# Patient Record
Sex: Male | Born: 1960 | Race: White | Hispanic: No | Marital: Married | Smoking: Former smoker
Health system: Southern US, Community
[De-identification: ages and names within clinical notes are randomized; demographics above are authoritative.]

## PROBLEM LIST (undated history)

## (undated) DIAGNOSIS — I639 Cerebral infarction, unspecified: Secondary | ICD-10-CM

## (undated) DIAGNOSIS — M1990 Unspecified osteoarthritis, unspecified site: Secondary | ICD-10-CM

## (undated) DIAGNOSIS — I10 Essential (primary) hypertension: Secondary | ICD-10-CM

## (undated) DIAGNOSIS — E079 Disorder of thyroid, unspecified: Secondary | ICD-10-CM

## (undated) DIAGNOSIS — I4891 Unspecified atrial fibrillation: Secondary | ICD-10-CM

## (undated) DIAGNOSIS — D497 Neoplasm of unspecified behavior of endocrine glands and other parts of nervous system: Secondary | ICD-10-CM

## (undated) DIAGNOSIS — E119 Type 2 diabetes mellitus without complications: Secondary | ICD-10-CM

## (undated) HISTORY — PX: OTHER SURGICAL HISTORY: SHX169

## (undated) HISTORY — PX: REPLACEMENT TOTAL KNEE BILATERAL: SUR1225

## (undated) HISTORY — PX: PITUITARY SURGERY: SHX203

## (undated) HISTORY — PX: EYE SURGERY: SHX253

## (undated) HISTORY — PX: TONSILLECTOMY: SUR1361

---

## 2019-07-04 ENCOUNTER — Other Ambulatory Visit: Payer: Self-pay

## 2019-07-04 ENCOUNTER — Ambulatory Visit (INDEPENDENT_AMBULATORY_CARE_PROVIDER_SITE_OTHER): Payer: 59

## 2019-07-04 ENCOUNTER — Ambulatory Visit
Admission: EM | Admit: 2019-07-04 | Discharge: 2019-07-04 | Disposition: A | Discharge status: Home / Self Care | Payer: 59 | Attending: Family Medicine | Admitting: Family Medicine

## 2019-07-04 DIAGNOSIS — M779 Enthesopathy, unspecified: Secondary | ICD-10-CM

## 2019-07-04 DIAGNOSIS — M19021 Primary osteoarthritis, right elbow: Secondary | ICD-10-CM

## 2019-07-04 HISTORY — DX: Type 2 diabetes mellitus without complications: E11.9

## 2019-07-04 HISTORY — DX: Unspecified atrial fibrillation: I48.91

## 2019-07-04 HISTORY — DX: Essential (primary) hypertension: I10

## 2019-07-04 MED ORDER — PREDNISONE 10 MG PO TABS
ORAL_TABLET | ORAL | 0 refills | Status: DC
Start: 2019-07-04 — End: 2020-01-05

## 2019-07-04 NOTE — ED Provider Notes (Signed)
[facility]    CSN: [id number] Arrival date & time: 07/04/19  1336      History   Chief Complaint Chief Complaint  Patient presents with  . Elbow Pain    HPI Patrick Sandoval is a 59 y.o. male.   59 yo male with a c/o right elbow pain and swelling for the past 2 weeks. Denies any traumatic injury, fevers, chills, redness, rash, numbness/tingling. States has been applying ice which helps.      Past Medical History:  Diagnosis Date  . Atrial fibrillation ([place])   . Diabetes mellitus without complication ([place])   . Hypertension     There are no problems to display for this patient.   Past Surgical History:  Procedure Laterality Date  . REPLACEMENT TOTAL KNEE BILATERAL         Home Medications    Prior to Admission medications   Medication Sig Start Date End Date Taking? Authorizing Provider  anagrelide (AGRYLIN) 0.5 MG capsule Take 0.5 mg by mouth 4 (four) times daily.   Yes [provider]  atorvastatin (LIPITOR) 10 MG tablet Take 10 mg by mouth daily.   Yes [provider]  furosemide (LASIX) 20 MG tablet Take 20 mg by mouth.   Yes [provider]  gabapentin (NEURONTIN) 300 MG capsule Take 300 mg by mouth 3 (three) times daily.   Yes [provider]  Insulin Glargine (LANTUS [place]) Inject into the skin.   Yes [provider]  INSULIN LISPRO [place] Inject into the skin.   Yes [provider]  levothyroxine (SYNTHROID) 125 MCG tablet Take 125 mcg by mouth daily before breakfast.   Yes [provider]  liothyronine (CYTOMEL) 5 MCG tablet Take 5 mcg by mouth daily.   Yes [provider]  metFORMIN (GLUCOPHAGE) 1000 MG tablet Take 1,000 mg by mouth 2 (two) times daily with a meal.   Yes [provider]  nebivolol (BYSTOLIC) 10 MG tablet Take 20 mg by mouth daily.   Yes [provider]  olmesartan-hydrochlorothiazide (BENICAR HCT) 40-25 MG tablet Take 1 tablet by mouth daily.    Yes [provider]  omeprazole (PRILOSEC) 20 MG capsule Take 20 mg by mouth daily.   Yes [provider]  rivaroxaban (XARELTO) 20 MG TABS tablet Take 20 mg by mouth daily with supper.   Yes [provider]  zolpidem (AMBIEN) 10 MG tablet Take 10 mg by mouth at bedtime as needed for sleep.   Yes [provider]  predniSONE (DELTASONE) 10 MG tablet Start 60 mg po day one, then 50 mg po day two, taper by 10 mg daily until complete. 07/04/19   Norval Gable, MD    Family History History reviewed. No pertinent family history.  Social History Social History   Tobacco Use  . Smoking status: Former Smoker    Quit date: 2010    Years since quitting: 11.4  . Smokeless tobacco: Never Used  Substance Use Topics  . Alcohol use: Not on file  . Drug use: Not on file     Allergies   Codeine, Morphine and related, and Vicodin [hydrocodone-acetaminophen]   Review of Systems Review of Systems   Physical Exam Triage Vital Signs ED Triage Vitals  Enc Vitals Group     BP 07/04/19 1353 (!) 155/89     Pulse Rate 07/04/19 1353 70     Resp 07/04/19 1353 17     Temp 07/04/19 1353 98 F (36.7 C)  Temp Source 07/04/19 1353 Oral     SpO2 07/04/19 1353 100 %     Weight --      Height --      Head Circumference --      Peak Flow --      Pain Score 07/04/19 1348 8     Pain Loc --      Pain Edu? --      Excl. in [place]? --    No data found.  Updated Vital Signs BP (!) 155/89 (BP Location: Left Arm)   Pulse 70 Comment: afib  Temp 98 F (36.7 C) (Oral)   Resp 17   SpO2 100%   Visual Acuity Right Eye Distance:   Left Eye Distance:   Bilateral Distance:    Right Eye Near:   Left Eye Near:    Bilateral Near:     Physical Exam Vitals and nursing note reviewed.  Constitutional:      General: He is not in acute distress.    Appearance: He is not toxic-appearing or diaphoretic.  Musculoskeletal:     Right elbow: Swelling present. No deformity,  effusion or lacerations. Decreased range of motion. Tenderness (diffuse) present.     Comments: Right upper extremity neurovascularly intact  Neurological:     Mental Status: He is alert.      UC Treatments / Results  Labs (all labs ordered are listed, but only abnormal results are displayed) Labs Reviewed - No data to display  EKG   Radiology DG Elbow Complete Right  Result Date: 07/04/2019 CLINICAL DATA:  Atraumatic right elbow pain and swelling. EXAM: RIGHT ELBOW - COMPLETE 3+ VIEW COMPARISON:  None. FINDINGS: There is no evidence of an acute fracture, dislocation, or joint effusion. A large bony spur is seen along the olecranon process. Chronic and degenerative deformities are seen along the medial epicondyle and lateral epicondyle of the distal right humerus. An additional small chronic deformity is seen involving the head of the right radius. Mild dorsal soft tissue swelling is seen. IMPRESSION: 1. No acute osseous abnormality. 2. Chronic and degenerative changes, as described above. Electronically Signed   By: Virgina Norfolk M.D.   On: 07/04/2019 15:14    Procedures Procedures (including critical care time)  Medications Ordered in UC Medications - No data to display  Initial Impression / Assessment and Plan / UC Course  I have reviewed the triage vital signs and the nursing notes.  Pertinent labs & imaging results that were available during my care of the patient were reviewed by me and considered in my medical decision making (see chart for details).     Final Clinical Impressions(s) / UC Diagnoses   Final diagnoses:  Primary osteoarthritis of right elbow  Bony spur     Discharge Instructions     Rest, ice, over the counter tylenol Follow up with orthopedist    ED Prescriptions    Medication Sig Dispense Auth. Provider   predniSONE (DELTASONE) 10 MG tablet Start 60 mg po day one, then 50 mg po day two, taper by 10 mg daily until complete. 21 tablet Norval Gable, MD      1. x-ray results and diagnosis reviewed with patient 2. rx as per orders above; reviewed possible side effects, interactions, risks and benefits  3. Recommend supportive treatment as above 4. Follow up with orthopedist 5. Follow-up prn if symptoms worsen or don't improve   I have reviewed the PDMP during this encounter.   Norval Gable, MD  07/04/19 1810  

## 2019-07-04 NOTE — ED Triage Notes (Signed)
Patient reports 2 week history of right elbow pain, no injury. Denies any increase in numbness or tingling distal to pain. Patient reports ice helps mildy, heat does not help. [place] is swollen. Limited ROM.

## 2019-07-04 NOTE — Discharge Instructions (Signed)
Rest, ice, over the counter tylenol Follow up with orthopedist

## 2020-01-05 ENCOUNTER — Other Ambulatory Visit: Payer: Self-pay

## 2020-01-05 ENCOUNTER — Ambulatory Visit
Admission: EM | Admit: 2020-01-05 | Discharge: 2020-01-05 | Disposition: A | Discharge status: Home / Self Care | Payer: 59

## 2020-01-05 ENCOUNTER — Encounter: Payer: Self-pay | Admitting: Emergency Medicine

## 2020-01-05 DIAGNOSIS — M7022 Olecranon bursitis, left elbow: Secondary | ICD-10-CM

## 2020-01-05 MED ORDER — PREDNISONE 10 MG (21) PO TBPK: ORAL_TABLET | Freq: Every day | ORAL | 0 refills | Status: DC

## 2020-01-05 NOTE — Discharge Instructions (Addendum)
Take the prednisone according to the package instructions.  Wear compression sleeve to help your body reabsorb the fluid in your elbow.  If the swelling increases, you develop redness, increased heat, or increased pain return for reevaluation and needle aspiration.

## 2020-01-05 NOTE — ED Triage Notes (Signed)
Pt c/o left elbow pain. Started about 3 weeks ago. He states he has had this similar problem in the past and was tendonitis. No known injury to elbow recently.

## 2020-01-05 NOTE — ED Provider Notes (Signed)
[facility]    CSN: [id number] Arrival date & time: 01/05/20  1441      History   Chief Complaint Chief Complaint  Patient presents with  . Elbow Pain    left    HPI Patrick Sandoval is a 59 y.o. male.   HPI   59 year old male here for evaluation of pain in his left elbow.  Patient reports that he has had pain for last 3 weeks.  Patient more said he had something like this in the past and he was diagnosed with tendinitis.  Patient has swelling to his elbow at the tip, it is warm, and mildly red.  The area is tender to touch.  Patient denies fever.  Patient does have numbness and tingling in his left ring and little finger which predates the symptoms.  Past Medical History:  Diagnosis Date  . Atrial fibrillation ([place])   . Diabetes mellitus without complication ([place])   . Hypertension     There are no problems to display for this patient.   Past Surgical History:  Procedure Laterality Date  . REPLACEMENT TOTAL KNEE BILATERAL         Home Medications    Prior to Admission medications   Medication Sig Start Date End Date Taking? Authorizing Provider  anagrelide (AGRYLIN) 0.5 MG capsule Take 0.5 mg by mouth 4 (four) times daily.   Yes [provider]  atorvastatin (LIPITOR) 10 MG tablet Take 10 mg by mouth daily.   Yes [provider]  furosemide (LASIX) 20 MG tablet Take 20 mg by mouth.   Yes [provider]  gabapentin (NEURONTIN) 300 MG capsule Take 300 mg by mouth 3 (three) times daily.   Yes [provider]  insulin lispro (HUMALOG KWIKPEN) 200 UNIT/ML KwikPen Inject into the skin. 12/12/19  Yes [provider]  INSULIN LISPRO [place] Inject into the skin.   Yes [provider]  levothyroxine (SYNTHROID) 125 MCG tablet Take by mouth. 03/19/19  Yes [provider]  liothyronine (CYTOMEL) 5 MCG tablet Take 1 tablet by mouth daily. 08/12/19  Yes [provider]  metFORMIN (GLUCOPHAGE) 1000  MG tablet Take 1,000 mg by mouth 2 (two) times daily with a meal.   Yes [provider]  nebivolol (BYSTOLIC) 10 MG tablet Take 20 mg by mouth daily.   Yes [provider]  olmesartan-hydrochlorothiazide (BENICAR HCT) 40-25 MG tablet Take 1 tablet by mouth daily.   Yes [provider]  omeprazole (PRILOSEC) 20 MG capsule Take 20 mg by mouth daily.   Yes [provider]  rivaroxaban (XARELTO) 20 MG TABS tablet Take 20 mg by mouth daily with supper.   Yes [provider]  TRULICITY 1.5 TU/8.8KC SOPN Inject into the skin. 11/20/19  Yes [provider]  zolpidem (AMBIEN) 10 MG tablet Take 10 mg by mouth at bedtime as needed for sleep.   Yes [provider]  TOUJEO MAX SOLOSTAR 300 UNIT/ML Solostar Pen Inject into the skin. 12/12/19 01/05/20 Yes [provider]  levothyroxine (SYNTHROID) 125 MCG tablet Take 125 mcg by mouth daily before breakfast.    [provider]  liothyronine (CYTOMEL) 5 MCG tablet Take 5 mcg by mouth daily.    [provider]  predniSONE (STERAPRED UNI-PAK 21 TAB) 10 MG (21) TBPK tablet Take by mouth daily. Take 6 tabs by mouth daily  for 2 days, then 5 tabs for 2 days, then 4 tabs for 2 days, then 3 tabs for 2  days, 2 tabs for 2 days, then 1 tab by mouth daily for 2 days 01/05/20   Margarette Canada, NP    Family History History reviewed. No pertinent family history.  Social History Social History   Tobacco Use  . Smoking status: Former Smoker    Quit date: 2010    Years since quitting: 11.9  . Smokeless tobacco: Never Used  Vaping Use  . Vaping Use: Never used  Substance Use Topics  . Alcohol use: Not Currently  . Drug use: Not Currently     Allergies   Codeine, Morphine and related, and Vicodin [hydrocodone-acetaminophen]   Review of Systems Review of Systems  Constitutional: Negative for activity change, appetite change and fever.  Musculoskeletal: Positive for arthralgias  and joint swelling. Negative for myalgias.  Skin: Positive for color change.  Neurological: Positive for numbness.  Hematological: Negative.   Psychiatric/Behavioral: Negative.      Physical Exam Triage Vital Signs ED Triage Vitals  Enc Vitals Group     BP 01/05/20 1509 119/74     Pulse Rate 01/05/20 1509 85     Resp 01/05/20 1509 18     Temp 01/05/20 1509 98 F (36.7 C)     Temp Source 01/05/20 1509 Oral     SpO2 01/05/20 1509 100 %     Weight 01/05/20 1506 278 lb (126.1 kg)     Height 01/05/20 1506 5\' 11"  (1.803 m)     Head Circumference --      Peak Flow --      Pain Score 01/05/20 1505 8     Pain Loc --      Pain Edu? --      Excl. in [place]? --    No data found.  Updated Vital Signs BP 119/74 (BP Location: Right Arm)   Pulse 85   Temp 98 F (36.7 C) (Oral)   Resp 18   Ht 5\' 11"  (1.803 m)   Wt 278 lb (126.1 kg)   SpO2 100%   BMI 38.77 kg/m   Visual Acuity Right Eye Distance:   Left Eye Distance:   Bilateral Distance:    Right Eye Near:   Left Eye Near:    Bilateral Near:     Physical Exam Vitals and nursing note reviewed.  Constitutional:      General: He is not in acute distress.    Appearance: Normal appearance. He is normal weight. He is not toxic-appearing.  HENT:     Head: Normocephalic and atraumatic.  Eyes:     General: No scleral icterus.    Extraocular Movements: Extraocular movements intact.     Conjunctiva/sclera: Conjunctivae normal.     Pupils: Pupils are equal, round, and reactive to light.  Musculoskeletal:        General: Swelling and tenderness present. No deformity or signs of injury. Normal range of motion.  Skin:    General: Skin is warm and dry.     Capillary Refill: Capillary refill takes less than 2 seconds.     Findings: Erythema present. No rash.  Neurological:     General: No focal deficit present.     Mental Status: He is alert and oriented to person, place, and time.  Psychiatric:        Mood and Affect: Mood normal.         Behavior: Behavior normal.        Thought Content: Thought content normal.        Judgment: Judgment normal.  UC Treatments / Results  Labs (all labs ordered are listed, but only abnormal results are displayed) Labs Reviewed - No data to display  EKG   Radiology No results found.  Procedures Procedures (including critical care time)  Medications Ordered in UC Medications - No data to display  Initial Impression / Assessment and Plan / UC Course  I have reviewed the triage vital signs and the nursing notes.  Pertinent labs & imaging results that were available during my care of the patient were reviewed by me and considered in my medical decision making (see chart for details).   Patient is here for evaluation of pain and swelling to his left elbow x3 weeks.  Physical exam reveals a swollen area over laying the olecranon process of the left elbow.  The area is mildly erythematous, warm, and tender to touch.  The area has mild fluctuance.  Exam is consistent with olecranon bursitis.  Patient offered the option of needle aspiration versus compression and steroid therapy.  Patient wants to try compression and steroid therapy first and if his symptoms get worse return for puncture aspiration.   Final Clinical Impressions(s) / UC Diagnoses   Final diagnoses:  Olecranon bursitis of left elbow     Discharge Instructions     Take the prednisone according to the package instructions.  Wear compression sleeve to help your body reabsorb the fluid in your elbow.  If the swelling increases, you develop redness, increased heat, or increased pain return for reevaluation and needle aspiration.    ED Prescriptions    Medication Sig Dispense Auth. Provider   predniSONE (STERAPRED UNI-PAK 21 TAB) 10 MG (21) TBPK tablet Take by mouth daily. Take 6 tabs by mouth daily  for 2 days, then 5 tabs for 2 days, then 4 tabs for 2 days, then 3 tabs for 2 days, 2 tabs for 2 days,  then 1 tab by mouth daily for 2 days 42 tablet Margarette Canada, NP     PDMP not reviewed this encounter.   Margarette Canada, NP 01/05/20 1552

## 2020-05-13 ENCOUNTER — Ambulatory Visit (INDEPENDENT_AMBULATORY_CARE_PROVIDER_SITE_OTHER): Payer: Medicare Other

## 2020-05-13 ENCOUNTER — Ambulatory Visit
Admission: EM | Admit: 2020-05-13 | Discharge: 2020-05-13 | Disposition: A | Discharge status: Home / Self Care | Payer: Medicare Other

## 2020-05-13 ENCOUNTER — Other Ambulatory Visit: Payer: Self-pay

## 2020-05-13 DIAGNOSIS — R059 Cough, unspecified: Secondary | ICD-10-CM | POA: Diagnosis not present

## 2020-05-13 DIAGNOSIS — R062 Wheezing: Secondary | ICD-10-CM | POA: Diagnosis not present

## 2020-05-13 DIAGNOSIS — J40 Bronchitis, not specified as acute or chronic: Secondary | ICD-10-CM | POA: Diagnosis not present

## 2020-05-13 HISTORY — DX: Neoplasm of unspecified behavior of endocrine glands and other parts of nervous system: D49.7

## 2020-05-13 MED ORDER — BENZONATATE 100 MG PO CAPS: 200.0000 mg | ORAL_CAPSULE | Freq: Three times a day (TID) | ORAL | 0 refills | Status: DC

## 2020-05-13 MED ORDER — PROMETHAZINE-DM 6.25-15 MG/5ML PO SYRP: 5.0000 mL | ORAL_SOLUTION | Freq: Four times a day (QID) | ORAL | 0 refills | Status: DC | PRN

## 2020-05-13 MED ORDER — ALBUTEROL SULFATE HFA 108 (90 BASE) MCG/ACT IN AERS: 2.0000 | INHALATION_SPRAY | RESPIRATORY_TRACT | 0 refills | Status: DC | PRN

## 2020-05-13 MED ORDER — AEROCHAMBER MV MISC: 2 refills | Status: DC

## 2020-05-13 NOTE — ED Provider Notes (Signed)
[facility]    CSN: [id number] Arrival date & time: 05/13/20  1118      History   Chief Complaint Chief Complaint  Patient presents with  . Cough    HPI Patrick Sandoval is a 60 y.o. male.   HPI   60 year old male here for evaluation of cough, chest congestion, and nasal congestion.  Patient reports that he has had symptoms for the past week that his cough is productive for green sputum.  Patient reports wheezing but denies shortness of breath.  Patient simply denies fever, runny nose, ear pain or pressure, sore throat, body aches, or GI complaints.  Past Medical History:  Diagnosis Date  . Atrial fibrillation ([place])   . Diabetes mellitus without complication ([place])   . Hypertension   . Pituitary tumor     There are no problems to display for this patient.   Past Surgical History:  Procedure Laterality Date  . EYE SURGERY Bilateral   . PITUITARY SURGERY    . REPLACEMENT TOTAL KNEE BILATERAL         Home Medications    Prior to Admission medications   Medication Sig Start Date End Date Taking? Authorizing Provider  albuterol (VENTOLIN HFA) 108 (90 Base) MCG/ACT inhaler Inhale 2 puffs into the lungs every 4 (four) hours as needed. 05/13/20  Yes Margarette Canada, NP  anagrelide (AGRYLIN) 0.5 MG capsule Take 0.5 mg by mouth 4 (four) times daily.   Yes [provider]  atorvastatin (LIPITOR) 10 MG tablet Take 10 mg by mouth daily.   Yes [provider]  benzonatate (TESSALON) 100 MG capsule Take 2 capsules (200 mg total) by mouth every 8 (eight) hours. 05/13/20  Yes Margarette Canada, NP  furosemide (LASIX) 20 MG tablet Take 20 mg by mouth.   Yes [provider]  gabapentin (NEURONTIN) 300 MG capsule Take 300 mg by mouth 3 (three) times daily.   Yes [provider]  insulin lispro (HUMALOG KWIKPEN) 200 UNIT/ML KwikPen Inject into the skin. 12/12/19  Yes [provider]  levothyroxine (SYNTHROID) 137 MCG tablet Take 137 mcg  by mouth every morning. 04/26/20  Yes [provider]  liothyronine (CYTOMEL) 5 MCG tablet Take 5 mcg by mouth daily.   Yes [provider]  metFORMIN (GLUCOPHAGE) 1000 MG tablet Take 1,000 mg by mouth 2 (two) times daily with a meal.   Yes [provider]  nebivolol (BYSTOLIC) 10 MG tablet Take 20 mg by mouth daily.   Yes [provider]  olmesartan-hydrochlorothiazide (BENICAR HCT) 40-25 MG tablet Take 1 tablet by mouth daily.   Yes [provider]  omeprazole (PRILOSEC) 20 MG capsule Take 20 mg by mouth daily.   Yes [provider]  promethazine-dextromethorphan (PROMETHAZINE-DM) 6.25-15 MG/5ML syrup Take 5 mLs by mouth 4 (four) times daily as needed. 05/13/20  Yes Margarette Canada, NP  rivaroxaban (XARELTO) 20 MG TABS tablet Take 20 mg by mouth daily with supper.   Yes [provider]  Spacer/Aero-Holding Chambers (AEROCHAMBER MV) inhaler Use as instructed 05/13/20  Yes Margarette Canada, NP  TRULICITY 1.5 VH/8.4ON SOPN Inject into the skin. 11/20/19  Yes [provider]  TOUJEO MAX SOLOSTAR 300 UNIT/ML Solostar Pen Inject into the skin. 12/12/19 01/05/20  [provider]  INSULIN LISPRO [place] Inject into the skin.    [provider]  zolpidem (AMBIEN) 10 MG tablet Take 10 mg by mouth at bedtime as needed for sleep.  05/13/20  [provider]  Family History History reviewed. No pertinent family history.  Social History Social History   Tobacco Use  . Smoking status: Former Smoker    Quit date: 2010    Years since quitting: 12.2  . Smokeless tobacco: Never Used  Vaping Use  . Vaping Use: Never used  Substance Use Topics  . Alcohol use: Not Currently  . Drug use: Not Currently     Allergies   Codeine, Meperidine, Morphine and related, and Vicodin [hydrocodone-acetaminophen]   Review of Systems Review of Systems  Constitutional: Negative for activity change, appetite change and fever.  HENT:  Positive for congestion and rhinorrhea. Negative for ear pain and sore throat.   Respiratory: Positive for cough and wheezing.   Gastrointestinal: Negative for diarrhea, nausea and vomiting.  Musculoskeletal: Negative for arthralgias and myalgias.  Skin: Negative for rash.  Hematological: Negative.   Psychiatric/Behavioral: Negative.      Physical Exam Triage Vital Signs ED Triage Vitals  Enc Vitals Group     BP 05/13/20 1142 124/72     Pulse Rate 05/13/20 1142 84     Resp 05/13/20 1142 18     Temp 05/13/20 1142 98.8 F (37.1 C)     Temp Source 05/13/20 1142 Oral     SpO2 05/13/20 1142 99 %     Weight 05/13/20 1137 276 lb (125.2 kg)     Height 05/13/20 1137 5\' 11"  (1.803 m)     Head Circumference --      Peak Flow --      Pain Score 05/13/20 1136 0     Pain Loc --      Pain Edu? --      Excl. in [place]? --    No data found.  Updated Vital Signs BP 124/72 (BP Location: Left Arm)   Pulse 84   Temp 98.8 F (37.1 C) (Oral)   Resp 18   Ht 5\' 11"  (1.803 m)   Wt 276 lb (125.2 kg)   SpO2 99%   BMI 38.49 kg/m   Visual Acuity Right Eye Distance:   Left Eye Distance:   Bilateral Distance:    Right Eye Near:   Left Eye Near:    Bilateral Near:     Physical Exam Vitals and nursing note reviewed.  Constitutional:      General: He is not in acute distress.    Appearance: Normal appearance. He is not ill-appearing.  HENT:     Head: Normocephalic and atraumatic.     Right Ear: Tympanic membrane and external ear normal.     Left Ear: Tympanic membrane, ear canal and external ear normal.     Nose: Congestion and rhinorrhea present.     Mouth/Throat:     Mouth: Mucous membranes are moist.     Pharynx: Oropharynx is clear. No posterior oropharyngeal erythema.  Cardiovascular:     Rate and Rhythm: Normal rate and regular rhythm.     Pulses: Normal pulses.     Heart sounds: Normal heart sounds. No murmur heard. No gallop.   Pulmonary:     Effort: Pulmonary effort is  normal.     Breath sounds: Wheezing and rhonchi present. No rales.  Musculoskeletal:     Cervical back: Normal range of motion and neck supple.  Lymphadenopathy:     Cervical: No cervical adenopathy.  Skin:    General: Skin is warm and dry.     Capillary Refill: Capillary refill takes less than 2 seconds.     Findings:  No erythema or rash.  Neurological:     General: No focal deficit present.     Mental Status: He is alert and oriented to person, place, and time.  Psychiatric:        Mood and Affect: Mood normal.        Behavior: Behavior normal.        Thought Content: Thought content normal.        Judgment: Judgment normal.      UC Treatments / Results  Labs (all labs ordered are listed, but only abnormal results are displayed) Labs Reviewed - No data to display  EKG   Radiology DG Chest 2 View  Result Date: 05/13/2020 CLINICAL DATA:  Cough and wheezing EXAM: CHEST - 2 VIEW COMPARISON:  None. FINDINGS: The lungs are clear. Heart size and pulmonary vascularity are normal. No adenopathy. There is degenerative change in the thoracic spine. There is aortic atherosclerosis. IMPRESSION: Lungs clear. Cardiac silhouette within normal limits. Aortic Atherosclerosis (ICD10-I70.0). Electronically Signed   By: Lowella Grip III M.D.   On: 05/13/2020 12:55    Procedures Procedures (including critical care time)  Medications Ordered in UC Medications - No data to display  Initial Impression / Assessment and Plan / UC Course  I have reviewed the triage vital signs and the nursing notes.  Pertinent labs & imaging results that were available during my care of the patient were reviewed by me and considered in my medical decision making (see chart for details).   Patient is a very pleasant 60 year old male here for evaluation of respiratory complaints of been going on for the past week and include nasal congestion, chest congestion, productive cough for green sputum, and wheezing.   Patient also thinks he has some wax in his left ear but does not have any ear pain.  Physical exam does reveal a ceruminous left external auditory canal.  The upper rim of the tympanic membrane is visible beyond the wax and it is pearly gray.  Right tympanic membrane and external auditory canal are within normal limits.  Nasal mucosa is mildly erythematous and edematous with scant nasal discharge.  Posterior oropharynx is unremarkable.  No cervical lymphadenopathy on exam.  Lung sounds have wheezes and rhonchi in bilateral upper lobes but no rales appreciated on exam.  Will obtain chest radiograph to evaluate for acute intrathoracic process.  Suspect patient has bronchitis.  Radiology read of the chest x-ray is that there is no acute intrathoracic process.   Will treat patient for bronchitis with Tessalon Perles and Promethazine DM to help him with his cough and will prescribe a albuterol inhaler with a spacer to help him with wheezing.    Final Clinical Impressions(s) / UC Diagnoses   Final diagnoses:  Bronchitis     Discharge Instructions     Use the albuterol inhaler with a spacer, 2 puffs every 4-6 hours, as needed for wheezing and cough.  Use the Tessalon Perles during the day as needed for cough and the Promethazine DM cough syrup at bedtime.  The cough syrup will make you drowsy but will help you with congestion as well.  Return for reevaluation for any new or worsening symptoms.    ED Prescriptions    Medication Sig Dispense Auth. Provider   Spacer/Aero-Holding Chambers (AEROCHAMBER MV) inhaler Use as instructed 1 each Margarette Canada, NP   benzonatate (TESSALON) 100 MG capsule Take 2 capsules (200 mg total) by mouth every 8 (eight) hours. 21 capsule Margarette Canada, NP  promethazine-dextromethorphan (PROMETHAZINE-DM) 6.25-15 MG/5ML syrup Take 5 mLs by mouth 4 (four) times daily as needed. 118 mL Margarette Canada, NP   albuterol (VENTOLIN HFA) 108 (90 Base) MCG/ACT inhaler Inhale 2  puffs into the lungs every 4 (four) hours as needed. 18 g Margarette Canada, NP     PDMP not reviewed this encounter.   Margarette Canada, NP 05/13/20 1309

## 2020-05-13 NOTE — ED Triage Notes (Signed)
Pt c/o cough and chest congestion for about a week, along with some minor nasal congestion. Pt denies f/n/v/d or other symptoms. Pt has been taking Mucinex and this has helped some.

## 2020-05-13 NOTE — Discharge Instructions (Addendum)
Use the albuterol inhaler with a spacer, 2 puffs every 4-6 hours, as needed for wheezing and cough.  Use the Tessalon Perles during the day as needed for cough and the Promethazine DM cough syrup at bedtime.  The cough syrup will make you drowsy but will help you with congestion as well.  Return for reevaluation for any new or worsening symptoms.

## 2020-05-21 ENCOUNTER — Ambulatory Visit
Admission: EM | Admit: 2020-05-21 | Discharge: 2020-05-21 | Disposition: A | Discharge status: Home / Self Care | Payer: Medicare Other | Attending: Emergency Medicine | Admitting: Emergency Medicine

## 2020-05-21 ENCOUNTER — Other Ambulatory Visit: Payer: Self-pay

## 2020-05-21 DIAGNOSIS — J0100 Acute maxillary sinusitis, unspecified: Secondary | ICD-10-CM

## 2020-05-21 DIAGNOSIS — M7012 Bursitis, left hand: Secondary | ICD-10-CM | POA: Diagnosis not present

## 2020-05-21 MED ORDER — AMOXICILLIN-POT CLAVULANATE 875-125 MG PO TABS: 1.0000 | ORAL_TABLET | Freq: Two times a day (BID) | ORAL | 0 refills | Status: AC

## 2020-05-21 MED ORDER — PREDNISONE 10 MG (21) PO TBPK: ORAL_TABLET | ORAL | 0 refills | Status: DC

## 2020-05-21 NOTE — ED Provider Notes (Signed)
[facility]    CSN: [id number] Arrival date & time: 05/21/20  1129      History   Chief Complaint Chief Complaint  Patient presents with  . Wrist Pain    left  . Cough  . Nasal Congestion    HPI Patrick Sandoval is a 60 y.o. male.   HPI   60 year old male here for evaluation of pain and swelling in his left wrist.  Patient reports that he developed pain and swelling to his left wrist 3 to 4 days ago.  He is not aware of any injuries and denies falls.  Patient does have a history of bursitis in his left elbow but has never had problems in his wrist before.  Patient denies numbness or tingling in his fingers but does report that he cannot close his fist completely due to the swelling and tightness.  There is no redness to the area and patient denies fever.  Additionally, patient has continued sinus pain and pressure with pain in his upper teeth that is been present for 8 days.  Patient reports that he is having mixed color and bloody nasal discharge and he is having a productive cough for yellow sputum.  Past Medical History:  Diagnosis Date  . Atrial fibrillation ([place])   . Diabetes mellitus without complication ([place])   . Hypertension   . Pituitary tumor     There are no problems to display for this patient.   Past Surgical History:  Procedure Laterality Date  . EYE SURGERY Bilateral   . PITUITARY SURGERY    . REPLACEMENT TOTAL KNEE BILATERAL         Home Medications    Prior to Admission medications   Medication Sig Start Date End Date Taking? Authorizing Provider  albuterol (VENTOLIN HFA) 108 (90 Base) MCG/ACT inhaler Inhale 2 puffs into the lungs every 4 (four) hours as needed. 05/13/20  Yes Margarette Canada, NP  amoxicillin-clavulanate (AUGMENTIN) 875-125 MG tablet Take 1 tablet by mouth every 12 (twelve) hours for 10 days. 05/21/20 05/31/20 Yes Margarette Canada, NP  anagrelide (AGRYLIN) 0.5 MG capsule Take 0.5 mg by mouth 4 (four) times daily.   Yes [provider]  atorvastatin (LIPITOR) 10 MG tablet Take 10 mg by mouth daily.   Yes [provider]  benzonatate (TESSALON) 100 MG capsule Take 2 capsules (200 mg total) by mouth every 8 (eight) hours. 05/13/20  Yes Margarette Canada, NP  furosemide (LASIX) 20 MG tablet Take 20 mg by mouth.   Yes [provider]  gabapentin (NEURONTIN) 300 MG capsule Take 300 mg by mouth 3 (three) times daily.   Yes [provider]  insulin lispro (HUMALOG KWIKPEN) 200 UNIT/ML KwikPen Inject into the skin. 12/12/19  Yes [provider]  INSULIN LISPRO [place] Inject into the skin.   Yes [provider]  levothyroxine (SYNTHROID) 137 MCG tablet Take 137 mcg by mouth every morning. 04/26/20  Yes [provider]  liothyronine (CYTOMEL) 5 MCG tablet Take 5 mcg by mouth daily.   Yes [provider]  metFORMIN (GLUCOPHAGE) 1000 MG tablet Take 1,000 mg by mouth 2 (two) times daily with a meal.   Yes [provider]  nebivolol (BYSTOLIC) 10 MG tablet Take 20 mg by mouth daily.   Yes [provider]  olmesartan-hydrochlorothiazide (BENICAR HCT) 40-25 MG tablet Take 1 tablet by mouth daily.   Yes [provider]  omeprazole (PRILOSEC) 20 MG capsule Take 20 mg by mouth daily.  Yes [provider]  predniSONE (STERAPRED UNI-PAK 21 TAB) 10 MG (21) TBPK tablet Take 6 tablets on day 1, 5 tablets day 2, 4 tablets day 3, 3 tablets day 4, 2 tablets day 5, 1 tablet day 6 05/21/20  Yes Margarette Canada, NP  promethazine-dextromethorphan (PROMETHAZINE-DM) 6.25-15 MG/5ML syrup Take 5 mLs by mouth 4 (four) times daily as needed. Patient taking differently: Take 5 mLs by mouth 4 (four) times daily as needed. 05/13/20  Yes Margarette Canada, NP  rivaroxaban (XARELTO) 20 MG TABS tablet Take 20 mg by mouth daily with supper.   Yes [provider]  Spacer/Aero-Holding Chambers (AEROCHAMBER MV) inhaler Use as instructed 05/13/20  Yes Margarette Canada, NP   TRULICITY 1.5 SW/5.4OE SOPN Inject into the skin. 11/20/19  Yes [provider]  TOUJEO MAX SOLOSTAR 300 UNIT/ML Solostar Pen Inject into the skin. 12/12/19 01/05/20  [provider]  zolpidem (AMBIEN) 10 MG tablet Take 10 mg by mouth at bedtime as needed for sleep.  05/13/20  [provider]    Family History History reviewed. No pertinent family history.  Social History Social History   Tobacco Use  . Smoking status: Former Smoker    Quit date: 2010    Years since quitting: 12.3  . Smokeless tobacco: Never Used  Vaping Use  . Vaping Use: Never used  Substance Use Topics  . Alcohol use: Not Currently  . Drug use: Not Currently     Allergies   Codeine, Meperidine, Morphine and related, and Vicodin [hydrocodone-acetaminophen]   Review of Systems Review of Systems  Constitutional: Negative for activity change, appetite change and fever.  HENT: Positive for congestion, rhinorrhea, sinus pressure and sinus pain.   Respiratory: Positive for cough.   Musculoskeletal: Positive for arthralgias, joint swelling and myalgias.  Skin: Negative for color change.  Hematological: Negative.   Psychiatric/Behavioral: Negative.      Physical Exam Triage Vital Signs ED Triage Vitals  Enc Vitals Group     BP 05/21/20 1153 136/86     Pulse Rate 05/21/20 1153 88     Resp 05/21/20 1153 18     Temp 05/21/20 1153 98.2 F (36.8 C)     Temp Source 05/21/20 1153 Oral     SpO2 05/21/20 1153 100 %     Weight 05/21/20 1151 276 lb (125.2 kg)     Height 05/21/20 1151 _0  (1.803 m)     Head Circumference --      Peak Flow --      Pain Score 05/21/20 1151 10     Pain Loc --      Pain Edu? --      Excl. in [place]? --    No data found.  Updated Vital Signs BP 136/86 (BP Location: Right Arm)   Pulse 88   Temp 98.2 F (36.8 C) (Oral)   Resp 18   Ht _1  (1.803 m)   Wt 276 lb (125.2 kg)   SpO2 100%   BMI 38.49 kg/m   Visual Acuity Right Eye Distance:    Left Eye Distance:   Bilateral Distance:    Right Eye Near:   Left Eye Near:    Bilateral Near:     Physical Exam Vitals and nursing note reviewed.  Constitutional:      General: He is not in acute distress.    Appearance: Normal appearance. He is not ill-appearing.  HENT:     Head: Normocephalic and atraumatic.     Right  Ear: Tympanic membrane, ear canal and external ear normal.     Left Ear: Tympanic membrane and external ear normal.     Nose: Congestion and rhinorrhea present.     Mouth/Throat:     Mouth: Mucous membranes are moist.     Pharynx: Oropharynx is clear.  Cardiovascular:     Rate and Rhythm: Normal rate and regular rhythm.     Pulses: Normal pulses.     Heart sounds: Normal heart sounds. No murmur heard. No gallop.   Pulmonary:     Effort: Pulmonary effort is normal.     Breath sounds: Normal breath sounds. No wheezing, rhonchi or rales.  Musculoskeletal:        General: Swelling and tenderness present. No deformity or signs of injury.  Skin:    General: Skin is warm and dry.     Capillary Refill: Capillary refill takes less than 2 seconds.     Findings: No bruising or erythema.  Neurological:     General: No focal deficit present.     Mental Status: He is alert and oriented to person, place, and time.  Psychiatric:        Mood and Affect: Mood normal.        Behavior: Behavior normal.        Thought Content: Thought content normal.        Judgment: Judgment normal.      UC Treatments / Results  Labs (all labs ordered are listed, but only abnormal results are displayed) Labs Reviewed - No data to display  EKG   Radiology No results found.  Procedures Procedures (including critical care time)  Medications Ordered in UC Medications - No data to display  Initial Impression / Assessment and Plan / UC Course  I have reviewed the triage vital signs and the nursing notes.  Pertinent labs & imaging results that were available during my care  of the patient were reviewed by me and considered in my medical decision making (see chart for details).   Patient is a pleasant 60 year old male here for evaluation of continued respiratory congestion with sinus pain and pressure and bloody nasal discharge x8 days.  Additionally, patient developed pain and swelling in his left wrist 3 to 4 days ago that is not a result of injury.  He was evaluated 8 days ago and treated for a viral URI with Tessalon Perles, Promethazine DM, albuterol inhaler without resolution of symptoms.  Physical exam reveals bilateral erythema and edema of nasal mucosa with green and bloody nasal discharge.  Patient does have tenderness to percussion of his maxillary sinuses bilaterally.  Tympanic membranes bilaterally are pearly gray with a normal light reflex and clear external auditory canals.  Patient has yellow postnasal drip in his posterior oropharynx.  Lungs are clear to auscultation all fields.  We will treat patient for maxillary sinusitis with Augmentin 875 twice daily x10 days and will give a prednisone pack to help with inflammation in sinuses as well as pain and inflammation in his left wrist.   Final Clinical Impressions(s) / UC Diagnoses   Final diagnoses:  Acute non-recurrent maxillary sinusitis  Bursitis of left wrist     Discharge Instructions     The Augmentin twice daily with food for 10 days for treatment of your sinusitis.  Start the prednisone tomorrow morning and take it each morning at breakfast.  You will take it for period of 6 days on a decreasing dose daily.  Keep an eye on  your blood sugar as this will cause your blood sugar to go up.  Your blood sugar should return to normal when you have finished the prednisone.  Perform sinus irrigation 2-3 times a day with a [employer] sinus rinse kit and distilled water.  Do not use tap water.  You can use plain over-the-counter Mucinex every 6 hours to break up the stickiness of the mucus so your body can  clear it.  Increase your oral fluid intake to thin out your mucus so that is also able for your body to clear more easily.  Take an over-the-counter probiotic, such as Culturelle-align-activia, 1 hour after each dose of antibiotic to prevent diarrhea.  If you develop any new or worsening symptoms return for reevaluation or see your primary care provider.     ED Prescriptions    Medication Sig Dispense Auth. Provider   amoxicillin-clavulanate (AUGMENTIN) 875-125 MG tablet Take 1 tablet by mouth every 12 (twelve) hours for 10 days. 20 tablet Margarette Canada, NP   predniSONE (STERAPRED UNI-PAK 21 TAB) 10 MG (21) TBPK tablet Take 6 tablets on day 1, 5 tablets day 2, 4 tablets day 3, 3 tablets day 4, 2 tablets day 5, 1 tablet day 6 21 tablet Margarette Canada, NP     PDMP not reviewed this encounter.   Margarette Canada, NP 05/21/20 1309

## 2020-05-21 NOTE — ED Triage Notes (Signed)
Pt c/o left wrist pain and swelling for several days. Pt has hx of bursitis in hie left elbow. Pt does have decreased ROM in the wrist. Pt also c/o continued cough and sinus congestion. Pt states the meds he was given at last OV did help some but he feels he is not improving. Pt denies f/n/v/d or other symptoms.

## 2020-05-21 NOTE — Discharge Instructions (Addendum)
The Augmentin twice daily with food for 10 days for treatment of your sinusitis.  Start the prednisone tomorrow morning and take it each morning at breakfast.  You will take it for period of 6 days on a decreasing dose daily.  Keep an eye on your blood sugar as this will cause your blood sugar to go up.  Your blood sugar should return to normal when you have finished the prednisone.  Perform sinus irrigation 2-3 times a day with a [employer] sinus rinse kit and distilled water.  Do not use tap water.  You can use plain over-the-counter Mucinex every 6 hours to break up the stickiness of the mucus so your body can clear it.  Increase your oral fluid intake to thin out your mucus so that is also able for your body to clear more easily.  Take an over-the-counter probiotic, such as Culturelle-align-activia, 1 hour after each dose of antibiotic to prevent diarrhea.  If you develop any new or worsening symptoms return for reevaluation or see your primary care provider.

## 2020-09-25 ENCOUNTER — Ambulatory Visit
Admission: EM | Admit: 2020-09-25 | Discharge: 2020-09-25 | Disposition: A | Discharge status: Home / Self Care | Payer: Medicare (Managed Care) | Attending: Emergency Medicine | Admitting: Emergency Medicine

## 2020-09-25 ENCOUNTER — Other Ambulatory Visit: Payer: Self-pay

## 2020-09-25 DIAGNOSIS — H6122 Impacted cerumen, left ear: Secondary | ICD-10-CM

## 2020-09-25 HISTORY — DX: Disorder of thyroid, unspecified: E07.9

## 2020-09-25 HISTORY — DX: Unspecified osteoarthritis, unspecified site: M19.90

## 2020-09-25 HISTORY — DX: Cerebral infarction, unspecified: I63.9

## 2020-09-25 NOTE — Discharge Instructions (Addendum)
Schedule an appointment with an ENT such as the one listed below.

## 2020-09-25 NOTE — ED Triage Notes (Signed)
Wife reports pt had taken a shower and got water in left ear, used ear wax removal to get water out, appears it made it worse per pt.  Last night left ear pain and decrease hearing, denies drainage.   Used heating pad against left ear with no improvement.

## 2020-09-25 NOTE — ED Provider Notes (Signed)
[facility]    CSN: [id number] Arrival date & time: 09/25/20  1248      History   Chief Complaint Chief Complaint  Patient presents with   Otalgia    HPI Patrick Sandoval is a 60 y.o. male.  Accompanied by his wife, patient presents with left ear pain and decreased hearing.  He states he got water in his ear yesterday and felt like his ear was clogged.  He is wife attempted to remove it using an earwax removal kit which was unsuccessful.  He denies ear drainage, fever, sore throat, cough, or other symptoms.  His medical history includes hypertension, atrial fibrillation, stroke, diabetes.  The history is provided by the patient and the spouse.   Past Medical History:  Diagnosis Date   Arthritis    Atrial fibrillation ([place])    Diabetes mellitus without complication ([place])    Hypertension    Pituitary tumor    Stroke [facility])    Thyroid disease     There are no problems to display for this patient.   Past Surgical History:  Procedure Laterality Date   bilteral rotator      EYE SURGERY Bilateral    PITUITARY SURGERY     REPLACEMENT TOTAL KNEE BILATERAL     TONSILLECTOMY         Home Medications    Prior to Admission medications   Medication Sig Start Date End Date Taking? Authorizing Provider  anagrelide (AGRYLIN) 0.5 MG capsule Take 0.5 mg by mouth 4 (four) times daily.   Yes [provider]  atorvastatin (LIPITOR) 10 MG tablet Take 10 mg by mouth daily.   Yes [provider]  furosemide (LASIX) 20 MG tablet Take 20 mg by mouth.   Yes [provider]  gabapentin (NEURONTIN) 300 MG capsule Take 300 mg by mouth 3 (three) times daily.   Yes [provider]  insulin lispro (HUMALOG KWIKPEN) 200 UNIT/ML KwikPen Inject into the skin. 12/12/19  Yes [provider]  INSULIN LISPRO [place] Inject into the skin.   Yes [provider]  levothyroxine (SYNTHROID) 137 MCG tablet Take 137 mcg by mouth every morning.  04/26/20  Yes [provider]  liothyronine (CYTOMEL) 5 MCG tablet Take 5 mcg by mouth daily.   Yes [provider]  metFORMIN (GLUCOPHAGE) 1000 MG tablet Take 1,000 mg by mouth 2 (two) times daily with a meal.   Yes [provider]  nebivolol (BYSTOLIC) 10 MG tablet Take 20 mg by mouth daily.   Yes [provider]  olmesartan-hydrochlorothiazide (BENICAR HCT) 40-25 MG tablet Take 1 tablet by mouth daily.   Yes [provider]  omeprazole (PRILOSEC) 20 MG capsule Take 20 mg by mouth daily.   Yes [provider]  rivaroxaban (XARELTO) 20 MG TABS tablet Take 20 mg by mouth daily with supper.   Yes [provider]  TRULICITY 1.5 IR/4.4RX SOPN Inject into the skin. 11/20/19  Yes [provider]  TOUJEO MAX SOLOSTAR 300 UNIT/ML Solostar Pen Inject into the skin. 12/12/19 01/05/20  [provider]  albuterol (VENTOLIN HFA) 108 (90 Base) MCG/ACT inhaler Inhale 2 puffs into the lungs every 4 (four) hours as needed. 05/13/20   Margarette Canada, NP  benzonatate (TESSALON) 100 MG capsule Take 2 capsules (200 mg total) by mouth every 8 (eight) hours. 05/13/20   Margarette Canada, NP  predniSONE (STERAPRED UNI-PAK 21 TAB) 10 MG (21) TBPK tablet Take 6 tablets on day 1, 5 tablets day  2, 4 tablets day 3, 3 tablets day 4, 2 tablets day 5, 1 tablet day 6 05/21/20   Margarette Canada, NP  promethazine-dextromethorphan (PROMETHAZINE-DM) 6.25-15 MG/5ML syrup Take 5 mLs by mouth 4 (four) times daily as needed. Patient taking differently: Take 5 mLs by mouth 4 (four) times daily as needed. 05/13/20   Margarette Canada, NP  Spacer/Aero-Holding Josiah Lobo (AEROCHAMBER MV) inhaler Use as instructed 05/13/20   Margarette Canada, NP  zolpidem (AMBIEN) 10 MG tablet Take 10 mg by mouth at bedtime as needed for sleep.  05/13/20  [provider]    Family History No family history on file.  Social History Social History   Tobacco Use   Smoking status: Former     Types: Cigarettes    Quit date: 2010    Years since quitting: 12.6   Smokeless tobacco: Never  Vaping Use   Vaping Use: Never used  Substance Use Topics   Alcohol use: Not Currently   Drug use: Not Currently     Allergies   Codeine, Meperidine, Morphine and related, and Vicodin [hydrocodone-acetaminophen]   Review of Systems Review of Systems  Constitutional:  Negative for chills and fever.  HENT:  Positive for ear pain and hearing loss. Negative for sore throat.   Respiratory:  Negative for cough and shortness of breath.   Cardiovascular:  Negative for chest pain and palpitations.  Skin:  Negative for color change and rash.  All other systems reviewed and are negative.   Physical Exam Triage Vital Signs ED Triage Vitals  Enc Vitals Group     BP      Pulse      Resp      Temp      Temp src      SpO2      Weight      Height      Head Circumference      Peak Flow      Pain Score      Pain Loc      Pain Edu?      Excl. in [place]?    No data found.  Updated Vital Signs BP 130/77 (BP Location: Right Arm)   Pulse 75   Temp 97.7 F (36.5 C) (Oral)   Ht 5' 11"  (1.803 m)   Wt 272 lb (123.4 kg)   SpO2 98%   BMI 37.94 kg/m   Visual Acuity Right Eye Distance:   Left Eye Distance:   Bilateral Distance:    Right Eye Near:   Left Eye Near:    Bilateral Near:     Physical Exam Vitals and nursing note reviewed.  Constitutional:      General: He is not in acute distress.    Appearance: He is well-developed. He is obese.  HENT:     Head: Normocephalic and atraumatic.     Right Ear: Tympanic membrane and ear canal normal. There is no impacted cerumen.     Left Ear: There is impacted cerumen.     Nose: Nose normal.     Mouth/Throat:     Mouth: Mucous membranes are moist.     Pharynx: Oropharynx is clear.  Eyes:     Conjunctiva/sclera: Conjunctivae normal.  Cardiovascular:     Rate and Rhythm: Normal rate and regular rhythm.     Heart sounds: Normal heart  sounds.  Pulmonary:     Effort: Pulmonary effort is normal. No respiratory distress.     Breath sounds: Normal breath sounds.  Abdominal:     Palpations: Abdomen is soft.     Tenderness: There is no abdominal tenderness.  Musculoskeletal:     Cervical back: Neck supple.  Skin:    General: Skin is warm and dry.  Neurological:     Mental Status: He is alert.  Psychiatric:        Mood and Affect: Mood normal.        Behavior: Behavior normal.     UC Treatments / Results  Labs (all labs ordered are listed, but only abnormal results are displayed) Labs Reviewed - No data to display  EKG   Radiology No results found.  Procedures Procedures (including critical care time)  Medications Ordered in UC Medications - No data to display  Initial Impression / Assessment and Plan / UC Course  I have reviewed the triage vital signs and the nursing notes.  Pertinent labs & imaging results that were available during my care of the patient were reviewed by me and considered in my medical decision making (see chart for details).  Left impacted cerumen.  Attempted irrigation by RN for cerumen removal; unable to remove.  Discussed with patient and his wife that they should schedule an appointment with an ENT.  Education provided on cerumen buildup.  They agree to plan of care.   Final Clinical Impressions(s) / UC Diagnoses   Final diagnoses:  Impacted cerumen of left ear     Discharge Instructions      Schedule an appointment with an ENT such as the one listed below.     ED Prescriptions   None    PDMP not reviewed this encounter.   Sharion Balloon, NP 09/25/20 1410

## 2020-09-25 NOTE — ED Notes (Signed)
Moderate amount of ear wax removed with ear irrigation x2, pt reports hearing has improved.  Claiborne Billings, NP to advise pt to f/u w/ENT for further evaluation and cerumen removal

## 2020-12-13 ENCOUNTER — Other Ambulatory Visit: Payer: Self-pay

## 2020-12-13 ENCOUNTER — Ambulatory Visit
Admission: EM | Admit: 2020-12-13 | Discharge: 2020-12-13 | Disposition: A | Discharge status: Home / Self Care | Payer: Medicare (Managed Care) | Attending: Emergency Medicine | Admitting: Emergency Medicine

## 2020-12-13 DIAGNOSIS — J0100 Acute maxillary sinusitis, unspecified: Secondary | ICD-10-CM

## 2020-12-13 DIAGNOSIS — J069 Acute upper respiratory infection, unspecified: Secondary | ICD-10-CM | POA: Diagnosis not present

## 2020-12-13 MED ORDER — AZITHROMYCIN 250 MG PO TABS: 250.0000 mg | ORAL_TABLET | Freq: Every day | ORAL | 0 refills | Status: DC

## 2020-12-13 MED ORDER — PREDNISONE 10 MG (21) PO TBPK: ORAL_TABLET | Freq: Every day | ORAL | 0 refills | Status: DC

## 2020-12-13 NOTE — Discharge Instructions (Addendum)
Use warm compress to eye  Take full dose of abx with food  Take tylenol as needed for pain  If sx become worse go to er

## 2020-12-13 NOTE — ED Provider Notes (Signed)
[facility]    CSN: [id number] Arrival date & time: 12/13/20  1062      History   Chief Complaint Chief Complaint  Patient presents with   Nasal Congestion   chest congestion    HPI Patrick Sandoval is a 60 y.o. male.   Rt eye redness and drainage.facial and sinus pressure with cough. Pt has been taking otc meds with no relief.    Past Medical History:  Diagnosis Date   Arthritis    Atrial fibrillation ([place])    Diabetes mellitus without complication ([place])    Hypertension    Pituitary tumor    Stroke [facility])    Thyroid disease     There are no problems to display for this patient.   Past Surgical History:  Procedure Laterality Date   bilteral rotator      EYE SURGERY Bilateral    PITUITARY SURGERY     REPLACEMENT TOTAL KNEE BILATERAL     TONSILLECTOMY         Home Medications    Prior to Admission medications   Medication Sig Start Date End Date Taking? Authorizing Provider  anagrelide (AGRYLIN) 0.5 MG capsule Take 0.5 mg by mouth 4 (four) times daily.   Yes [provider]  atorvastatin (LIPITOR) 10 MG tablet Take 10 mg by mouth daily.   Yes [provider]  azithromycin (ZITHROMAX) 250 MG tablet Take 1 tablet (250 mg total) by mouth daily. Take first 2 tablets together, then 1 every day until finished. 12/13/20  Yes Marney Setting, NP  furosemide (LASIX) 20 MG tablet Take 20 mg by mouth.   Yes [provider]  gabapentin (NEURONTIN) 300 MG capsule Take 300 mg by mouth 3 (three) times daily.   Yes [provider]  insulin lispro (HUMALOG KWIKPEN) 200 UNIT/ML KwikPen Inject into the skin. 12/12/19  Yes [provider]  INSULIN LISPRO [place] Inject into the skin.   Yes [provider]  levothyroxine (SYNTHROID) 137 MCG tablet Take 137 mcg by mouth every morning. 04/26/20  Yes [provider]  liothyronine (CYTOMEL) 5 MCG tablet Take 5 mcg by mouth daily.   Yes [provider]   metFORMIN (GLUCOPHAGE) 1000 MG tablet Take 1,000 mg by mouth 2 (two) times daily with a meal.   Yes [provider]  nebivolol (BYSTOLIC) 10 MG tablet Take 20 mg by mouth daily.   Yes [provider]  olmesartan-hydrochlorothiazide (BENICAR HCT) 40-25 MG tablet Take 1 tablet by mouth daily.   Yes [provider]  omeprazole (PRILOSEC) 20 MG capsule Take 20 mg by mouth daily.   Yes [provider]  predniSONE (STERAPRED UNI-PAK 21 TAB) 10 MG (21) TBPK tablet Take by mouth daily. Take 6 tabs by mouth daily  for 2 days, then 5 tabs for 2 days, then 4 tabs for 2 days, then 3 tabs for 2 days, 2 tabs for 2 days, then 1 tab by mouth daily for 2 days 12/13/20  Yes Marney Setting, NP  rivaroxaban (XARELTO) 20 MG TABS tablet Take 20 mg by mouth daily with supper.   Yes [provider]  TRULICITY 1.5 IR/4.8NI SOPN Inject into the skin. 11/20/19  Yes [provider]  TOUJEO MAX SOLOSTAR 300 UNIT/ML Solostar Pen Inject into the skin. 12/12/19 01/05/20  [provider]  albuterol (VENTOLIN HFA) 108 (90 Base) MCG/ACT inhaler Inhale 2 puffs into the lungs every 4 (four) hours as needed. 05/13/20   Margarette Canada, NP  benzonatate (TESSALON) 100 MG capsule Take 2 capsules (200 mg total) by mouth every 8 (eight) hours. 05/13/20   Margarette Canada, NP  predniSONE (STERAPRED UNI-PAK 21 TAB) 10 MG (21) TBPK tablet Take 6 tablets on day 1, 5 tablets day 2, 4 tablets day 3, 3 tablets day 4, 2 tablets day 5, 1 tablet day 6 05/21/20   Margarette Canada, NP  promethazine-dextromethorphan (PROMETHAZINE-DM) 6.25-15 MG/5ML syrup Take 5 mLs by mouth 4 (four) times daily as needed. Patient taking differently: Take 5 mLs by mouth 4 (four) times daily as needed. 05/13/20   Margarette Canada, NP  Spacer/Aero-Holding Josiah Lobo (AEROCHAMBER MV) inhaler Use as instructed 05/13/20   Margarette Canada, NP  zolpidem (AMBIEN) 10 MG tablet Take 10 mg by mouth at bedtime as needed for sleep.  05/13/20   [provider]    Family History History reviewed. No pertinent family history.  Social History Social History   Tobacco Use   Smoking status: Former    Types: Cigarettes    Quit date: 2010    Years since quitting: 12.8   Smokeless tobacco: Never  Vaping Use   Vaping Use: Never used  Substance Use Topics   Alcohol use: Not Currently   Drug use: Not Currently     Allergies   Codeine, Meperidine, Morphine and related, and Vicodin [hydrocodone-acetaminophen]   Review of Systems Review of Systems  Constitutional:  Negative for activity change, chills and fever.  HENT:  Positive for congestion, postnasal drip, rhinorrhea, sinus pressure, sinus pain and sore throat.   Eyes:  Positive for discharge, redness and itching.  Respiratory:  Positive for cough. Negative for shortness of breath.   Cardiovascular: Negative.   Gastrointestinal: Negative.   Genitourinary: Negative.   Skin: Negative.   Neurological: Negative.     Physical Exam Triage Vital Signs ED Triage Vitals  Enc Vitals Group     BP 12/13/20 0937 100/60     Pulse Rate 12/13/20 0937 89     Resp 12/13/20 0937 20     Temp 12/13/20 0937 97.7 F (36.5 C)     Temp Source 12/13/20 0937 Oral     SpO2 12/13/20 0937 100 %     Weight 12/13/20 0935 272 lb (123.4 kg)     Height 12/13/20 0935 5\' 11"  (1.803 m)     Head Circumference --      Peak Flow --      Pain Score 12/13/20 0935 0     Pain Loc --      Pain Edu? --      Excl. in [place]? --    No data found.  Updated Vital Signs BP 100/60 (BP Location: Left Arm)   Pulse 89   Temp 97.7 F (36.5 C) (Oral)   Resp 20   Ht 5\' 11"  (1.803 m)   Wt 272 lb (123.4 kg)   SpO2 100%   BMI 37.94 kg/m   Visual Acuity Right Eye Distance:   Left Eye Distance:   Bilateral Distance:    Right Eye Near:   Left Eye Near:    Bilateral Near:     Physical Exam Constitutional:      Appearance: Normal appearance. He is obese.  HENT:     Right Ear: Tympanic  membrane normal.     Left Ear: Tympanic membrane normal.     Nose: Congestion and rhinorrhea present.     Mouth/Throat:     Mouth: Mucous membranes are moist.     Pharynx:  Posterior oropharyngeal erythema present.  Eyes:     General:        Left eye: Discharge present. Cardiovascular:     Rate and Rhythm: Normal rate.  Pulmonary:     Effort: Pulmonary effort is normal.  Abdominal:     General: Abdomen is flat.  Musculoskeletal:     Cervical back: Normal range of motion.  Skin:    General: Skin is warm.  Neurological:     General: No focal deficit present.     Mental Status: He is alert.     UC Treatments / Results  Labs (all labs ordered are listed, but only abnormal results are displayed) Labs Reviewed - No data to display  EKG   Radiology No results found.  Procedures Procedures (including critical care time)  Medications Ordered in UC Medications - No data to display  Initial Impression / Assessment and Plan / UC Course  I have reviewed the triage vital signs and the nursing notes.  Pertinent labs & imaging results that were available during my care of the patient were reviewed by me and considered in my medical decision making (see chart for details).     Use warm compress to eye  Take full dose of abx with food  Take tylenol as needed for pain  If sx become worse go to er   Final Clinical Impressions(s) / UC Diagnoses   Final diagnoses:  Viral URI with cough  Acute non-recurrent maxillary sinusitis     Discharge Instructions      Use warm compress to eye  Take full dose of abx with food  Take tylenol as needed for pain  If sx become worse go to er      ED Prescriptions     Medication Sig Dispense Auth. Provider   predniSONE (STERAPRED UNI-PAK 21 TAB) 10 MG (21) TBPK tablet Take by mouth daily. Take 6 tabs by mouth daily  for 2 days, then 5 tabs for 2 days, then 4 tabs for 2 days, then 3 tabs for 2 days, 2 tabs for 2 days, then 1 tab by  mouth daily for 2 days 42 tablet Morley Kos L, NP   azithromycin (ZITHROMAX) 250 MG tablet Take 1 tablet (250 mg total) by mouth daily. Take first 2 tablets together, then 1 every day until finished. 6 tablet Marney Setting, NP      PDMP not reviewed this encounter.   Marney Setting, NP 12/13/20 [id number]

## 2020-12-13 NOTE — ED Triage Notes (Signed)
Pt here with C/O nasal and chest congestion for 2 days. Right eye is red. Facial pressure.

## 2021-02-14 ENCOUNTER — Other Ambulatory Visit: Payer: Self-pay

## 2021-02-14 ENCOUNTER — Ambulatory Visit
Admission: EM | Admit: 2021-02-14 | Discharge: 2021-02-14 | Disposition: A | Discharge status: Home / Self Care | Payer: Medicare HMO

## 2021-02-14 DIAGNOSIS — M778 Other enthesopathies, not elsewhere classified: Secondary | ICD-10-CM | POA: Diagnosis not present

## 2021-02-14 DIAGNOSIS — M7031 Other bursitis of elbow, right elbow: Secondary | ICD-10-CM | POA: Diagnosis not present

## 2021-02-14 MED ORDER — PREDNISONE 20 MG PO TABS: 40.0000 mg | ORAL_TABLET | Freq: Every day | ORAL | 0 refills | Status: AC

## 2021-02-14 MED ORDER — HYDROCODONE-ACETAMINOPHEN 5-325 MG PO TABS: 2.0000 | ORAL_TABLET | Freq: Three times a day (TID) | ORAL | 0 refills | Status: AC | PRN

## 2021-02-14 NOTE — Discharge Instructions (Addendum)
-  You have bursitis and tendinitis of your elbow. - You need to be icing it every couple of hours to help with swelling and pain.  Also keep it elevated to help with swelling.  We have provided you with an Ace wrap. -Begin using over-the-counter Voltaren gel to help with inflammation. -I sent a prescription for hydrocodone if absolutely needed.  If it causes you to be nauseous like he is experienced in the past, discontinue use. - I printed a prescription for prednisone.  If you get too nauseous or have bad side effects with this like last time, discontinue use and follow-up with orthopedics.  You can go to [facility] walk-in in [place].

## 2021-02-14 NOTE — ED Provider Notes (Signed)
[facility]    CSN: [id number] Arrival date & time: 02/14/21  3220      History   Chief Complaint Chief Complaint  Patient presents with   Elbow Pain    HPI Patrick Sandoval is a 61 y.o. male presenting for right elbow pain for the past 1 week.  Patient says that he fell asleep on this elbow and woke up the next morning with a lot of pain.  Reports history of bursitis in the elbow and says it feels similar.  He has been taking Tylenol and his home gabapentin but says pain is still there and has not improved.  Patient has inability to fully extend the elbow due to pain.  No associated numbness or tingling or weakness.  Denies any injury.  No fever, redness.  History of arthritis for which he takes the gabapentin.  Also history of A. fib, diabetes, hypertension, previous stroke, thyroid disease and CKD.  HPI  Past Medical History:  Diagnosis Date   Arthritis    Atrial fibrillation ([place])    Diabetes mellitus without complication ([place])    Hypertension    Pituitary tumor    Stroke [facility])    Thyroid disease     There are no problems to display for this patient.   Past Surgical History:  Procedure Laterality Date   bilteral rotator      EYE SURGERY Bilateral    PITUITARY SURGERY     REPLACEMENT TOTAL KNEE BILATERAL     TONSILLECTOMY         Home Medications    Prior to Admission medications   Medication Sig Start Date End Date Taking? Authorizing Provider  HYDROcodone-acetaminophen (NORCO/VICODIN) 5-325 MG tablet Take 2 tablets by mouth every 8 (eight) hours as needed for up to 2 days. 02/14/21 02/16/21 Yes Laurene Footman B, PA-C  insulin aspart (NOVOLOG FLEXPEN) 100 UNIT/ML FlexPen Inject into the skin. 02/03/21 05/04/21 Yes [provider]  insulin detemir (LEVEMIR FLEXTOUCH) 100 UNIT/ML FlexPen Inject into the skin. 02/03/21 05/04/21 Yes [provider]  insulin glargine, 1 Unit Dial, (TOUJEO SOLOSTAR) 300 UNIT/ML Solostar Pen Inject into the skin.  09/20/20  Yes [provider]  predniSONE (DELTASONE) 20 MG tablet Take 2 tablets (40 mg total) by mouth daily for 5 days. 02/14/21 02/19/21 Yes Danton Clap, PA-C  anagrelide (AGRYLIN) 0.5 MG capsule Take 0.5 mg by mouth 4 (four) times daily.    [provider]  atorvastatin (LIPITOR) 10 MG tablet Take 10 mg by mouth daily.    [provider]  furosemide (LASIX) 20 MG tablet Take 20 mg by mouth.    [provider]  gabapentin (NEURONTIN) 300 MG capsule Take 300 mg by mouth 3 (three) times daily.    [provider]  insulin lispro (HUMALOG KWIKPEN) 200 UNIT/ML KwikPen Inject into the skin. 12/12/19   [provider]  INSULIN LISPRO [place] Inject into the skin.    [provider]  LEVEMIR FLEXTOUCH 100 UNIT/ML FlexTouch Pen Inject into the skin. 02/04/21   [provider]  levothyroxine (SYNTHROID) 137 MCG tablet Take 137 mcg by mouth every morning. 04/26/20   [provider]  liothyronine (CYTOMEL) 5 MCG tablet Take 5 mcg by mouth daily.    [provider]  metFORMIN (GLUCOPHAGE) 1000 MG tablet Take 1,000 mg by mouth 2 (two) times daily with a meal.    [provider]  nebivolol (BYSTOLIC) 10 MG tablet Take 20 mg by mouth daily.  [provider]  NOVOLOG FLEXPEN 100 UNIT/ML FlexPen Inject into the skin. 02/04/21   [provider]  olmesartan-hydrochlorothiazide (BENICAR HCT) 40-25 MG tablet Take 1 tablet by mouth daily.    [provider]  omeprazole (PRILOSEC) 20 MG capsule Take 20 mg by mouth daily.    [provider]  rivaroxaban (XARELTO) 20 MG TABS tablet Take 20 mg by mouth daily with supper.    [provider]  Spacer/Aero-Holding [place] (AEROCHAMBER MV) inhaler Use as instructed 05/13/20   Margarette Canada, NP  TOUJEO SOLOSTAR 300 UNIT/ML Solostar Pen SMARTSIG:100 Unit(s) SUB-Q Every Morning 09/21/20   [provider]  TRULICITY 1.5 IW/5.8KD SOPN  Inject into the skin. 11/20/19   [provider]  zolpidem (AMBIEN) 10 MG tablet Take 10 mg by mouth at bedtime as needed for sleep.  05/13/20  [provider]    Family History History reviewed. No pertinent family history.  Social History Social History   Tobacco Use   Smoking status: Former    Types: Cigarettes    Quit date: 2010    Years since quitting: 13.0   Smokeless tobacco: Never  Vaping Use   Vaping Use: Never used  Substance Use Topics   Alcohol use: Yes    Comment: occasional   Drug use: Not Currently     Allergies   Codeine, Meperidine, Morphine and related, Vicodin [hydrocodone-acetaminophen], and Prednisone   Review of Systems Review of Systems  Constitutional:  Negative for fatigue and fever.  Musculoskeletal:  Positive for arthralgias and joint swelling.  Skin:  Negative for color change, rash and wound.  Neurological:  Negative for weakness and numbness.    Physical Exam Triage Vital Signs ED Triage Vitals [02/14/21 0828]  Enc Vitals Group     BP      Pulse      Resp      Temp      Temp src      SpO2      Weight 272 lb (123.4 kg)     Height 5\' 11"  (1.803 m)     Head Circumference      Peak Flow      Pain Score 10     Pain Loc      Pain Edu?      Excl. in [place]?    No data found.  Updated Vital Signs BP 103/60 (BP Location: Left Arm)    Pulse 78    Temp 98.3 F (36.8 C) (Oral)    Resp 18    Ht 5\' 11"  (1.803 m)    Wt 272 lb (123.4 kg)    SpO2 100%    BMI 37.94 kg/m     Physical Exam Vitals and nursing note reviewed.  Constitutional:      General: He is not in acute distress.    Appearance: Normal appearance. He is well-developed.  HENT:     Head: Normocephalic and atraumatic.  Eyes:     General: No scleral icterus.    Conjunctiva/sclera: Conjunctivae normal.  Cardiovascular:     Rate and Rhythm: Normal rate and regular rhythm.     Pulses: Normal pulses.  Pulmonary:     Effort: Pulmonary effort is normal. No  respiratory distress.     Breath sounds: Normal breath sounds.  Musculoskeletal:     Right elbow: Swelling (mild swelling over olecrenon) present. Decreased range of motion (reduce to 30 degrees extension, full flexion). Tenderness present in lateral epicondyle and olecranon process.  Cervical back: Neck supple.  Skin:    General: Skin is warm and dry.     Capillary Refill: Capillary refill takes less than 2 seconds.  Neurological:     General: No focal deficit present.     Mental Status: He is alert. Mental status is at baseline.     Motor: No weakness.     Coordination: Coordination normal.     Gait: Gait normal.  Psychiatric:        Mood and Affect: Mood normal.        Behavior: Behavior normal.        Thought Content: Thought content normal.     UC Treatments / Results  Labs (all labs ordered are listed, but only abnormal results are displayed) Labs Reviewed - No data to display  EKG   Radiology No results found.  Procedures Procedures (including critical care time)  Medications Ordered in UC Medications - No data to display  Initial Impression / Assessment and Plan / UC Course  I have reviewed the triage vital signs and the nursing notes.  Pertinent labs & imaging results that were available during my care of the patient were reviewed by me and considered in my medical decision making (see chart for details).  61 year old male presenting for right elbow pain x1 week.  No injury.  On exam he has mild swelling over the olecranon with tenderness to this region as well as tenderness palpation of the lateral epicondyle.  Reduced range of motion to 30 degrees of extension, full flexion.  Patient's exam and symptoms consistent with olecranon bursitis and epicondylitis/tendinitis.  Discussed this with patient.  Patient cannot take NSAIDs.  I have advised him to begin icing the elbow and try Voltaren gel as well as Tylenol for pain.  Provided him with a few hydrocodone if  absolutely needed for pain relief.  Patient has taken this before but is felt a little nauseous when taking it.  I did discuss other medication with him including tramadol but he says he would like to try the hydrocodone.  I also printed prescription for prednisone.  Patient reports feeling very nauseous the last time he took it but he did take 60 mg for 2 days then 50 mg for 2 days.  Hopefully on the 40 mg dose he does okay.  He says he has had prednisone before November and did fine with it.  Patient was also taking azithromycin at that time and unsure if he had taken that before.  So, his symptoms could have been related to the azithromycin.  Advised keeping a close eye on his blood sugars if he does take the prednisone.  Advised following up with orthopedics if not getting any better over the next week or for any worsening symptoms.  Patient also given Ace wrap.  Advised follow-up here as needed.   Final Clinical Impressions(s) / UC Diagnoses   Final diagnoses:  Bursitis of right elbow, unspecified bursa  Tendinitis of right elbow     Discharge Instructions      -You have bursitis and tendinitis of your elbow. - You need to be icing it every couple of hours to help with swelling and pain.  Also keep it elevated to help with swelling.  We have provided you with an Ace wrap. -Begin using over-the-counter Voltaren gel to help with inflammation. -I sent a prescription for hydrocodone if absolutely needed.  If it causes you to be nauseous like he is experienced in the past,  discontinue use. - I printed a prescription for prednisone.  If you get too nauseous or have bad side effects with this like last time, discontinue use and follow-up with orthopedics.  You can go to [facility] walk-in in [place].     ED Prescriptions     Medication Sig Dispense Auth. Provider   HYDROcodone-acetaminophen (NORCO/VICODIN) 5-325 MG tablet Take 2 tablets by mouth every 8 (eight) hours as needed for up to 2  days. 6 tablet Laurene Footman B, PA-C   predniSONE (DELTASONE) 20 MG tablet Take 2 tablets (40 mg total) by mouth daily for 5 days. 10 tablet Danton Clap, PA-C      I have reviewed the PDMP during this encounter.   Danton Clap, PA-C 02/14/21 [id number]

## 2021-02-14 NOTE — ED Triage Notes (Signed)
Pt with history of bursitis in elbow and wrist. Reports one week of right elbow pain and swelling.

## 2021-08-15 IMAGING — CR DG ELBOW COMPLETE 3+V*R*
4 series · 4 of 4 positions shown · non-contrast
Comparison: None.

CLINICAL DATA: Atraumatic right elbow pain and swelling.

EXAM:
RIGHT ELBOW - COMPLETE 3+ VIEW

[elbow ap]
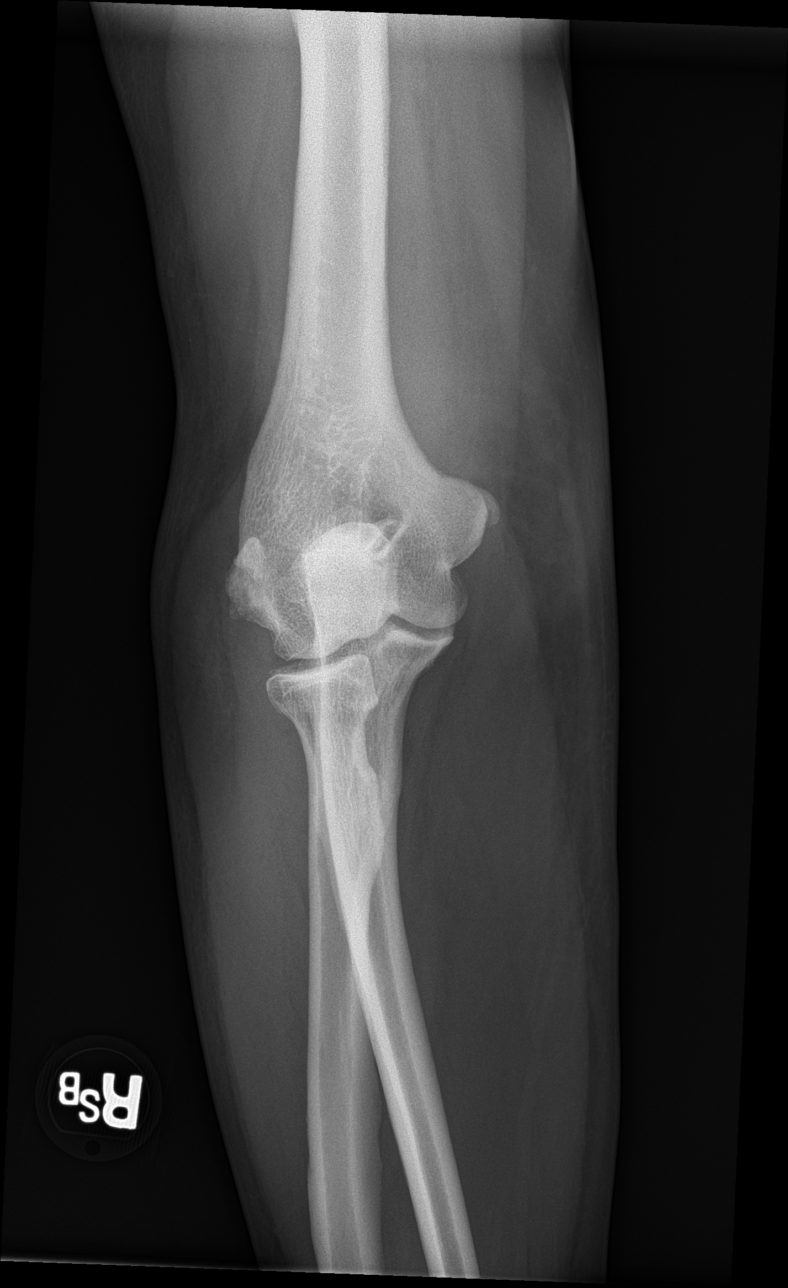

[elbow obl (1 of 2)]
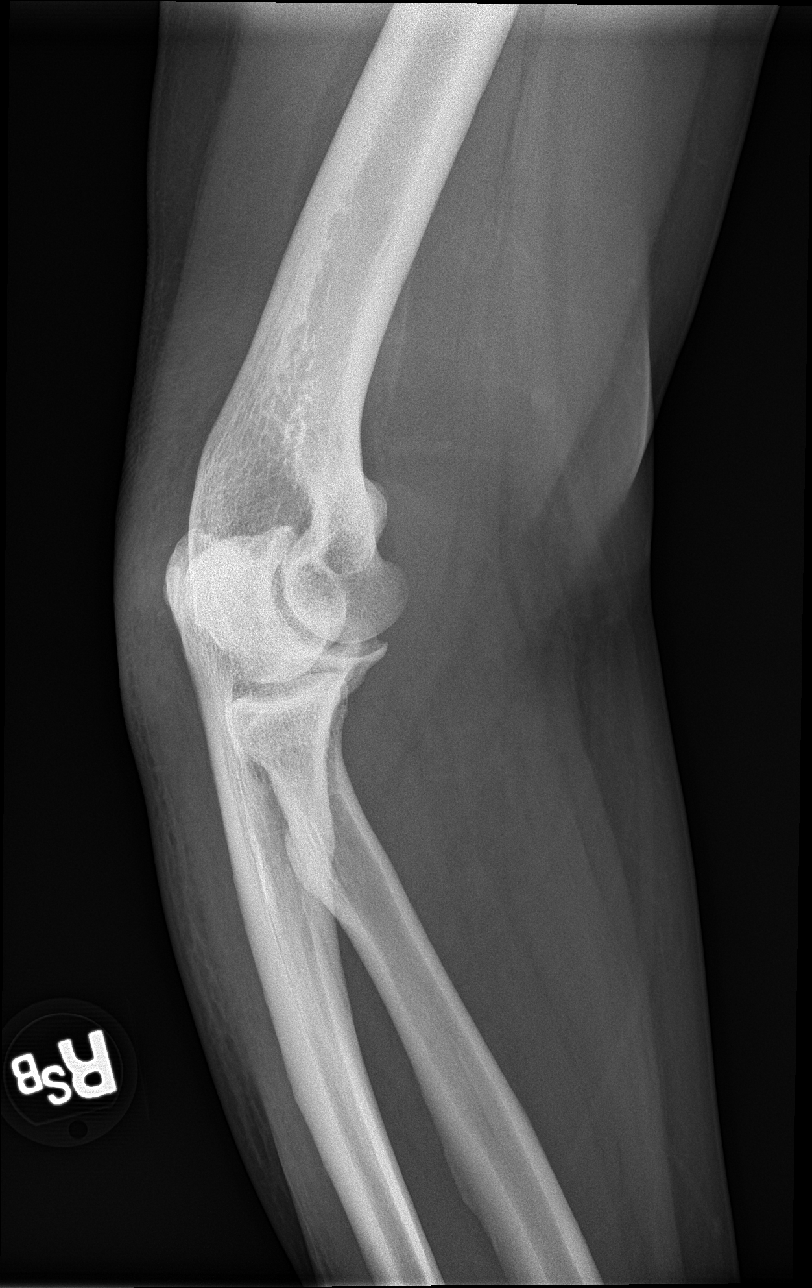

[elbow obl (2 of 2)]
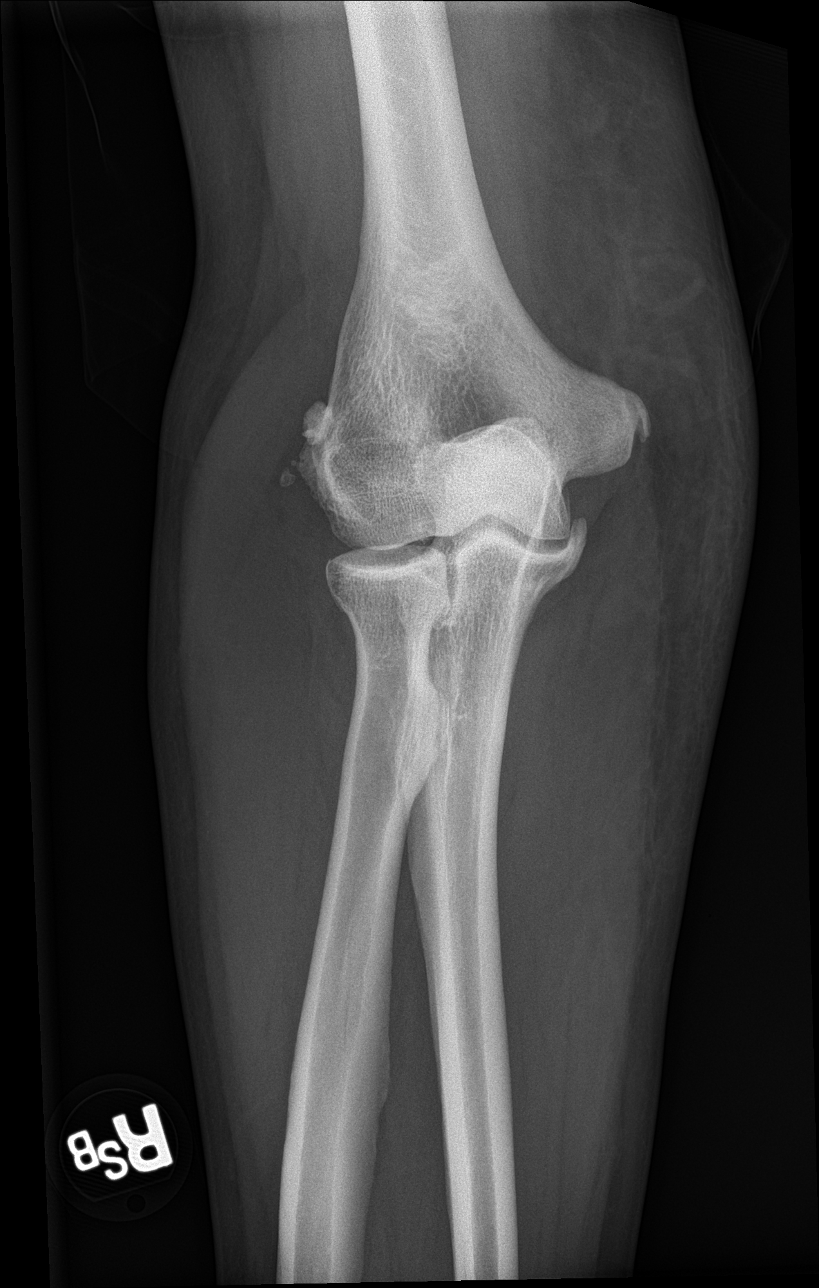

[elbow lat]
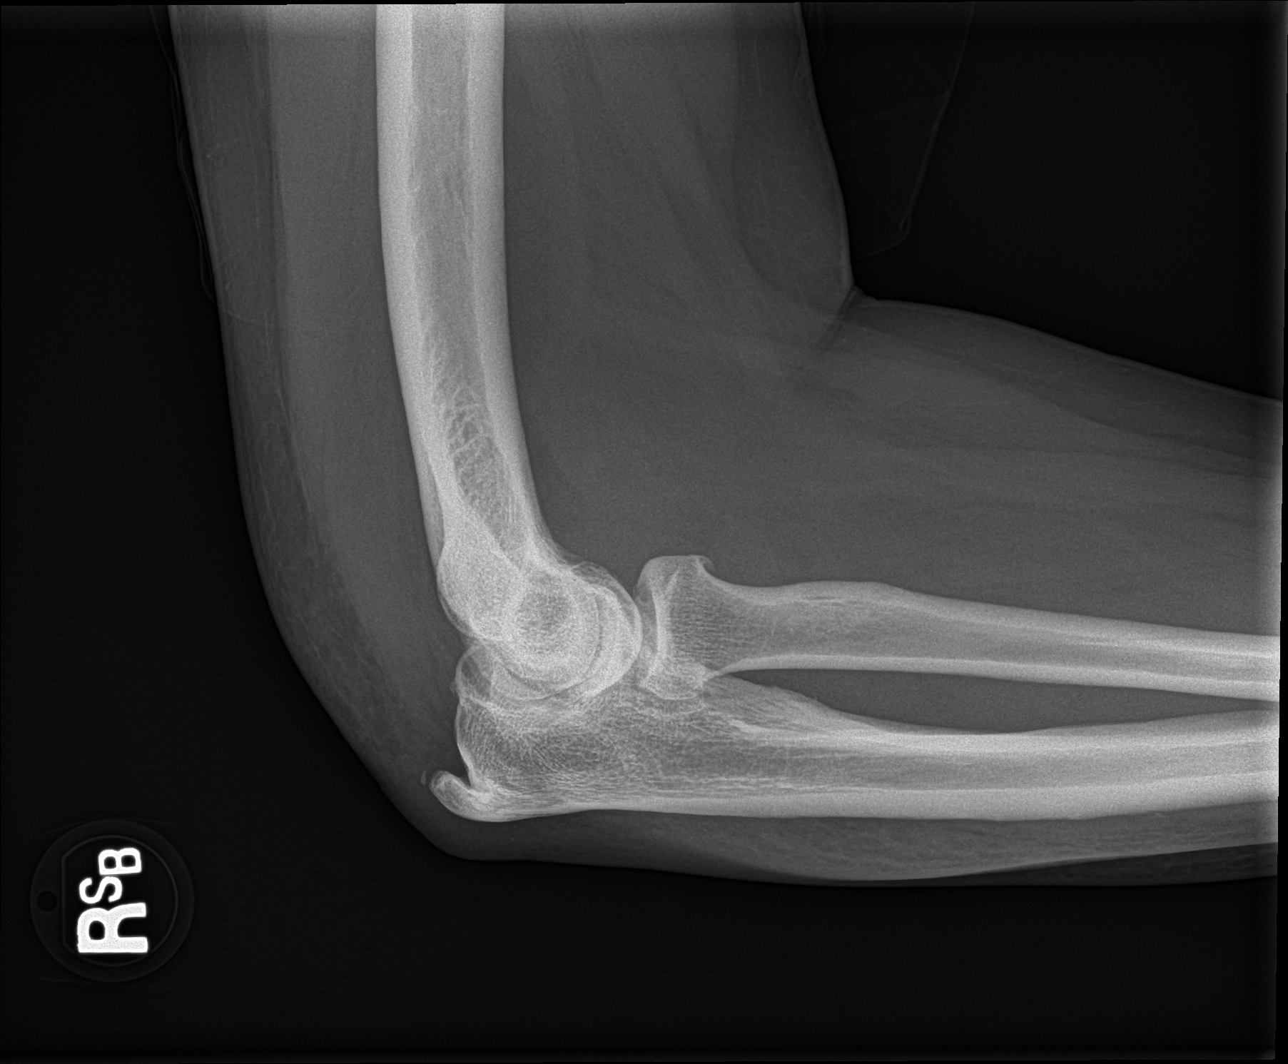

[4 of 4 positions shown; findings below may reference images not displayed]

FINDINGS: There is no evidence of an acute fracture, dislocation, or joint
effusion. A large bony spur is seen along the olecranon process.
Chronic and degenerative deformities are seen along the medial
epicondyle and lateral epicondyle of the distal right humerus. An
additional small chronic deformity is seen involving the head of the
right radius. Mild dorsal soft tissue swelling is seen.
IMPRESSION: 1. No acute osseous abnormality.
2. Chronic and degenerative changes, as described above.

## 2021-10-09 ENCOUNTER — Emergency Department
Admission: EM | Admit: 2021-10-09 | Discharge: 2021-10-09 | Disposition: A | Discharge status: Home / Self Care | Payer: Medicare HMO | Attending: Emergency Medicine | Admitting: Emergency Medicine

## 2021-10-09 DIAGNOSIS — I83899 Varicose veins of unspecified lower extremities with other complications: Secondary | ICD-10-CM | POA: Diagnosis present

## 2021-10-09 DIAGNOSIS — I10 Essential (primary) hypertension: Secondary | ICD-10-CM | POA: Diagnosis not present

## 2021-10-09 DIAGNOSIS — Z7901 Long term (current) use of anticoagulants: Secondary | ICD-10-CM | POA: Insufficient documentation

## 2021-10-09 DIAGNOSIS — E119 Type 2 diabetes mellitus without complications: Secondary | ICD-10-CM | POA: Insufficient documentation

## 2021-10-09 NOTE — ED Triage Notes (Signed)
Pt BIB [facility] from home C/O varicose vein bleeding on LLE. Pt reports being on Xarelto for Afib.

## 2021-10-09 NOTE — ED Provider Notes (Signed)
[facility] Provider Note    Event Date/Time   First MD Initiated Contact with Patient 10/09/21 1258     (approximate)   History   Chief Complaint Leg Injury   HPI  Patrick Sandoval is a 61 y.o. male with past medical history of hypertension, diabetes, stroke, and atrial fibrillation on Xarelto who presents to the ED complaining of leg injury.  Patient reports that just prior to arrival he rubbed his left lower leg up against something and then noticed significant bleeding.  EMS reports bleeding seem to be coming from a varicose vein, pressure dressing was applied with improvement.  Patient denies significant associated pain with the area of bleeding, denies any associated laceration.  He recently had surgery on his right foot for diabetic ulcer, has just completed antibiotics for this.  He denies any worsening drainage, redness, or swelling from his right foot.     Physical Exam   Triage Vital Signs: ED Triage Vitals  Enc Vitals Group     BP 10/09/21 1255 (!) 166/104     Pulse Rate 10/09/21 1255 83     Resp 10/09/21 1255 16     Temp 10/09/21 1255 98.2 F (36.8 C)     Temp Source 10/09/21 1255 Oral     SpO2 10/09/21 1252 98 %     Weight 10/09/21 1256 270 lb (122.5 kg)     Height 10/09/21 1256 '5\' 11"'$  (1.803 m)     Head Circumference --      Peak Flow --      Pain Score 10/09/21 1256 0     Pain Loc --      Pain Edu? --      Excl. in [place]? --     Most recent vital signs: Vitals:   10/09/21 1252 10/09/21 1255  BP:  (!) 166/104  Pulse:  83  Resp:  16  Temp:  98.2 F (36.8 C)  SpO2: 98% 98%    Constitutional: Alert and oriented. Eyes: Conjunctivae are normal. Head: Atraumatic. Nose: No congestion/rhinnorhea. Mouth/Throat: Mucous membranes are moist.  Cardiovascular: Normal rate, regular rhythm. Grossly normal heart sounds.  2+ radial and DP pulses bilaterally. Respiratory: Normal respiratory effort.  No retractions. Lungs  CTAB. Gastrointestinal: Soft and nontender. No distention. Musculoskeletal: No lower extremity tenderness nor edema.  Small bleeding varicose vein noted to left lower leg with no erythema, warmth, or purulent drainage. Neurologic:  Normal speech and language. No gross focal neurologic deficits are appreciated.    ED Results / Procedures / Treatments   Labs (all labs ordered are listed, but only abnormal results are displayed) Labs Reviewed - No data to display  PROCEDURES:  Critical Care performed: No  Procedures   MEDICATIONS ORDERED IN ED: Medications - No data to display   IMPRESSION / MDM / [place] / ED COURSE  I reviewed the triage vital signs and the nursing notes.                              61 y.o. male with past medical history of hypertension, diabetes, stroke, and atrial fibrillation on Xarelto who presents to the ED complaining of bleeding from his left lower leg starting just prior to arrival.  Patient's presentation is most consistent with acute, uncomplicated illness.  Differential diagnosis includes, but is not limited to, varicose vein, laceration, anemia.  Patient nontoxic-appearing and in no acute distress, vital signs remarkable  for elevated blood pressure but otherwise reassuring.  He remains neurovascular intact to his left lower extremity, does have some ongoing bleeding from apparent varicose vein to his left lower leg.  Area of bleeding was dressed with Surgicel and pressure dressing, we will observe for hemostasis but doubt significant anemia at this time.  No ongoing bleeding noted through dressing after greater than 1 hour of observation.  Patient is appropriate for discharge home with PCP follow-up as needed, was counseled to remove dressing in 24 hours and to return to the ED for new or worsening symptoms.  Patient agrees with plan.      FINAL CLINICAL IMPRESSION(S) / ED DIAGNOSES   Final diagnoses:  Bleeding from varicose vein      Rx / DC Orders   ED Discharge Orders     None        Note:  This document was prepared using Dragon voice recognition software and may include unintentional dictation errors.   Blake Divine, MD 10/09/21 1409

## 2021-10-09 NOTE — ED Notes (Signed)
Wound to LLE noted to be oozing when EMS gauze dressing removed. Dr. Charna Archer at bedside during triage, placed surgicell dressing wrapped in coban.

## 2021-10-09 NOTE — ED Notes (Signed)
AVS provided to and discussed with patient and family member at bedside. Pt verbalizes understanding of discharge instructions and denies any questions or concerns at this time. Pt taken out of department via W/C.

## 2021-10-09 NOTE — ED Notes (Signed)
Bleeding remains controlled at this time.

## 2021-11-08 HISTORY — PX: LEG AMPUTATION BELOW KNEE: SHX694

## 2021-11-26 ENCOUNTER — Ambulatory Visit
Admission: EM | Admit: 2021-11-26 | Discharge: 2021-11-26 | Disposition: A | Discharge status: Home / Self Care | Payer: Medicare HMO | Attending: Internal Medicine | Admitting: Internal Medicine

## 2021-11-26 DIAGNOSIS — B372 Candidiasis of skin and nail: Secondary | ICD-10-CM | POA: Diagnosis present

## 2021-11-26 DIAGNOSIS — N200 Calculus of kidney: Secondary | ICD-10-CM | POA: Insufficient documentation

## 2021-11-26 LAB — URINALYSIS, ROUTINE W REFLEX MICROSCOPIC
Bilirubin Urine: NEGATIVE
Glucose, UA: NEGATIVE mg/dL
Ketones, ur: NEGATIVE mg/dL
Leukocytes,Ua: NEGATIVE
Nitrite: NEGATIVE
Protein, ur: NEGATIVE mg/dL
Specific Gravity, Urine: <1.005 — ABNORMAL LOW (ref 1.005–1.030)
pH: 5 (ref 5.0–8.0)

## 2021-11-26 LAB — URINALYSIS, MICROSCOPIC (REFLEX)
Squamous Epithelial / HPF: NONE SEEN (ref 0–5)
WBC, UA: NONE SEEN WBC/hpf (ref 0–5)

## 2021-11-26 MED ORDER — TAMSULOSIN HCL 0.4 MG PO CAPS: 0.4000 mg | ORAL_CAPSULE | Freq: Every day | ORAL | 0 refills | Status: AC

## 2021-11-26 MED ORDER — FLUCONAZOLE 150 MG PO TABS: 150.0000 mg | ORAL_TABLET | Freq: Once | ORAL | 0 refills | Status: AC

## 2021-11-26 NOTE — Discharge Instructions (Signed)
You were seen for blood in urine and are being treated for possible kidney stone and yeast infection.   -You are being prescribed a medication to help with the urinary passage.  Take as directed. -You are also being prescribed a one-time dose of fluconazole for yeast infection. -Increase your hydration. -Follow-up at your local emergency department if you have worsening issues starting stream, fevers, worsening back pain.  Take care, Dr. Marland Kitchen, NP-c

## 2021-11-26 NOTE — ED Provider Notes (Signed)
[facility] - [place], [place]   Name: Patrick Sandoval DOB: [birth year] MRN: [id number] CSN: [id number] PCP: Overton Mam, MD  Arrival date and time:  11/26/21 1313  Chief Complaint:  Urinary Tract Infection   NOTE: Prior to seeing the patient today, I have reviewed the triage nursing documentation and vital signs. Clinical staff has updated patient's PMH/PSHx, current medication list, and drug allergies/intolerances to ensure comprehensive history available to assist in medical decision making.   History:   HPI: Patrick Sandoval is a 61 y.o. male who presents today with complaints of blood in his urine.  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  It started off as/scant amount of blood on the pad after using urinal.  Over the past 24 hours, patient's wife is noticed visible blood in his urine after using the urinal.  Patient has noticed mild burning post urination as well.  This morning he woke up with right-sided back pain, but he attributed that to sleeping incorrectly.  There has been mild issues starting the stream, with patient stating that he needs to concentrate in order to start urinating.  He had 1 episode of emesis earlier today.  No Foley that was placed during his operation was removed promptly in recovery.  No known prostate/urology concerns.    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 No pertinent family history.  Social History   Tobacco Use   Smoking status: Former    Types: Cigarettes     Quit date: 2010    Years since quitting: 13.8   Smokeless tobacco: Never  Vaping Use   Vaping Use: Never used  Substance Use Topics   Alcohol use: Yes    Comment: occasional   Drug use: Not Currently    There are no problems to display for this patient.   Home Medications:    Current Meds  Medication Sig   anagrelide (AGRYLIN) 0.5 MG capsule Take 0.5 mg by mouth 4 (four) times daily.   atorvastatin (LIPITOR) 10 MG tablet Take 10 mg by mouth daily.   furosemide (LASIX) 20 MG tablet Take 20 mg by mouth.   gabapentin (NEURONTIN) 300 MG capsule Take 300 mg by mouth 3 (three) times daily.   insulin glargine, 1 Unit Dial, (TOUJEO SOLOSTAR) 300 UNIT/ML Solostar Pen Inject into the skin.   insulin lispro (HUMALOG KWIKPEN) 200 UNIT/ML KwikPen Inject into the skin.   INSULIN LISPRO [place] Inject into the skin.   LEVEMIR FLEXTOUCH 100 UNIT/ML FlexTouch Pen Inject into the skin.   levothyroxine (SYNTHROID) 137 MCG tablet Take 137 mcg by mouth every morning.   liothyronine (CYTOMEL) 5 MCG tablet Take 5 mcg by mouth daily.   metFORMIN (GLUCOPHAGE) 1000 MG tablet Take 1,000 mg by mouth 2 (two) times daily with a meal.   nebivolol (BYSTOLIC) 10 MG tablet Take 20 mg by mouth daily.   NOVOLOG FLEXPEN 100 UNIT/ML FlexPen Inject  into the skin.   olmesartan-hydrochlorothiazide (BENICAR HCT) 40-25 MG tablet Take 1 tablet by mouth daily.   omeprazole (PRILOSEC) 20 MG capsule Take 20 mg by mouth daily.   rivaroxaban (XARELTO) 20 MG TABS tablet Take 20 mg by mouth daily with supper.   Spacer/Aero-Holding Chambers (AEROCHAMBER MV) inhaler Use as instructed   TOUJEO SOLOSTAR 300 UNIT/ML Solostar Pen SMARTSIG:100 Unit(s) SUB-Q Every Morning   TRULICITY 1.5 TG/6.2IR SOPN Inject into the skin.    Allergies:   Codeine, Meperidine, Morphine and related, Vicodin [hydrocodone-acetaminophen], and Prednisone  Review of Systems (ROS): Review of Systems  Constitutional:  Negative for activity change, chills,  diaphoresis, fatigue and fever.  Gastrointestinal:  Positive for vomiting.  Genitourinary:  Positive for dysuria, flank pain and hematuria. Negative for decreased urine volume, penile pain, penile swelling and scrotal swelling.     Vital Signs: Today's Vitals   11/26/21 1331 11/26/21 1332 11/26/21 1334  BP:   131/66  Pulse:   76  Resp:   16  Temp:   98.6 F (37 C)  TempSrc:   Oral  SpO2:   100%  Weight:  265 lb (120.2 kg)   Height:  '5\' 11"'$  (1.803 m)   PainSc: 0-No pain      Physical Exam: Physical Exam Vitals and nursing note reviewed.  Constitutional:      Appearance: Normal appearance.  Cardiovascular:     Rate and Rhythm: Normal rate and regular rhythm.     Pulses: Normal pulses.     Heart sounds: Normal heart sounds.  Pulmonary:     Effort: Pulmonary effort is normal.     Breath sounds: Normal breath sounds.  Abdominal:     Tenderness: There is no abdominal tenderness. There is no right CVA tenderness or left CVA tenderness.  Musculoskeletal:     Right Lower Extremity: Right leg is amputated below knee.  Skin:    General: Skin is warm and dry.  Neurological:     General: No focal deficit present.     Mental Status: He is alert and oriented to person, place, and time.  Psychiatric:        Mood and Affect: Mood normal.        Behavior: Behavior normal.      Urgent Care Treatments / Results:   LABS: PLEASE NOTE: all labs that were ordered this encounter are listed, however only abnormal results are displayed. Labs Reviewed  URINALYSIS, ROUTINE W REFLEX MICROSCOPIC - Abnormal; Notable for the following components:      Result Value   Specific Gravity, Urine <1.005 (*)    Hgb urine dipstick MODERATE (*)    All other components within normal limits  URINALYSIS, MICROSCOPIC (REFLEX) - Abnormal; Notable for the following components:   Bacteria, UA RARE (*)    All other components within normal limits  URINE CULTURE    EKG: -None  RADIOLOGY: No results  found.  PROCEDURES: Procedures  MEDICATIONS RECEIVED THIS VISIT: Medications - No data to display  PERTINENT CLINICAL COURSE NOTES/UPDATES:   Initial Impression / Assessment and Plan / Urgent Care Course:  Pertinent labs & imaging results that were available during my care of the patient were personally reviewed by me and considered in my medical decision making (see lab/imaging section of note for values and interpretations).  Kalel Harty is a 61 y.o. male who presents to [facility] today with complaints of hematuria, diagnosed with possible kidney stone and yeast infection to genitalia, and treated as  such with the medications below. NP and patient reviewed discharge instructions below during visit.  Urine culture was also sent to rule out possible infection.  Patient is well appearing overall in clinic today. He does not appear to be in any acute distress. Presenting symptoms (see HPI) and exam as documented above.   I have reviewed the follow up and strict return precautions for any new or worsening symptoms. Patient is aware of symptoms that would be deemed urgent/emergent, and would thus require further evaluation either here or in the emergency department. At the time of discharge, he verbalized understanding and consent with the discharge plan as it was reviewed with him. All questions were fielded by provider and/or clinic staff prior to patient discharge.    Final Clinical Impressions / Urgent Care Diagnoses:   Final diagnoses:  Nephrolithiasis  Skin yeast infection    New Prescriptions:  [place] Controlled Substance Registry consulted? Not Applicable  Meds ordered this encounter  Medications   fluconazole (DIFLUCAN) 150 MG tablet    Sig: Take 1 tablet (150 mg total) by mouth once for 1 dose.    Dispense:  1 tablet    Refill:  0   tamsulosin (FLOMAX) 0.4 MG CAPS capsule    Sig: Take 1 capsule (0.4 mg total) by mouth daily for 14 days.    Dispense:  14 capsule     Refill:  0      Discharge Instructions      You were seen for blood in urine and are being treated for possible kidney stone and yeast infection.   -You are being prescribed a medication to help with the urinary passage.  Take as directed. -You are also being prescribed a one-time dose of fluconazole for yeast infection. -Increase your hydration. -Follow-up at your local emergency department if you have worsening issues starting stream, fevers, worsening back pain.  Take care, Dr. Marland Kitchen, NP-c      Recommended Follow up Care:  Patient encouraged to follow up with the following provider within the specified time frame, or sooner as dictated by the severity of his symptoms. As always, he was instructed that for any urgent/emergent care needs, he should seek care either here or in the emergency department for more immediate evaluation.   Gertie Baron, DNP, NP-c   Gertie Baron, NP 11/26/21 1500

## 2021-11-26 NOTE — ED Triage Notes (Signed)
Pt c/o peeing blood x1day  Pt denies any penile injury or trauma.   Pt wife states that the blood is bright red and settles to the bottom of the toilet bowl. Pt also would have blood along the tip of the penis.   Pt states that he saw blood every time he urinated this morning.  Pt had a right lower leg amputation on 11/08/21.

## 2022-06-25 IMAGING — CR DG CHEST 2V
2 series · 2 of 2 positions shown · non-contrast
Comparison: None.

CLINICAL DATA: Cough and wheezing

EXAM:
CHEST - 2 VIEW

[chest pa]
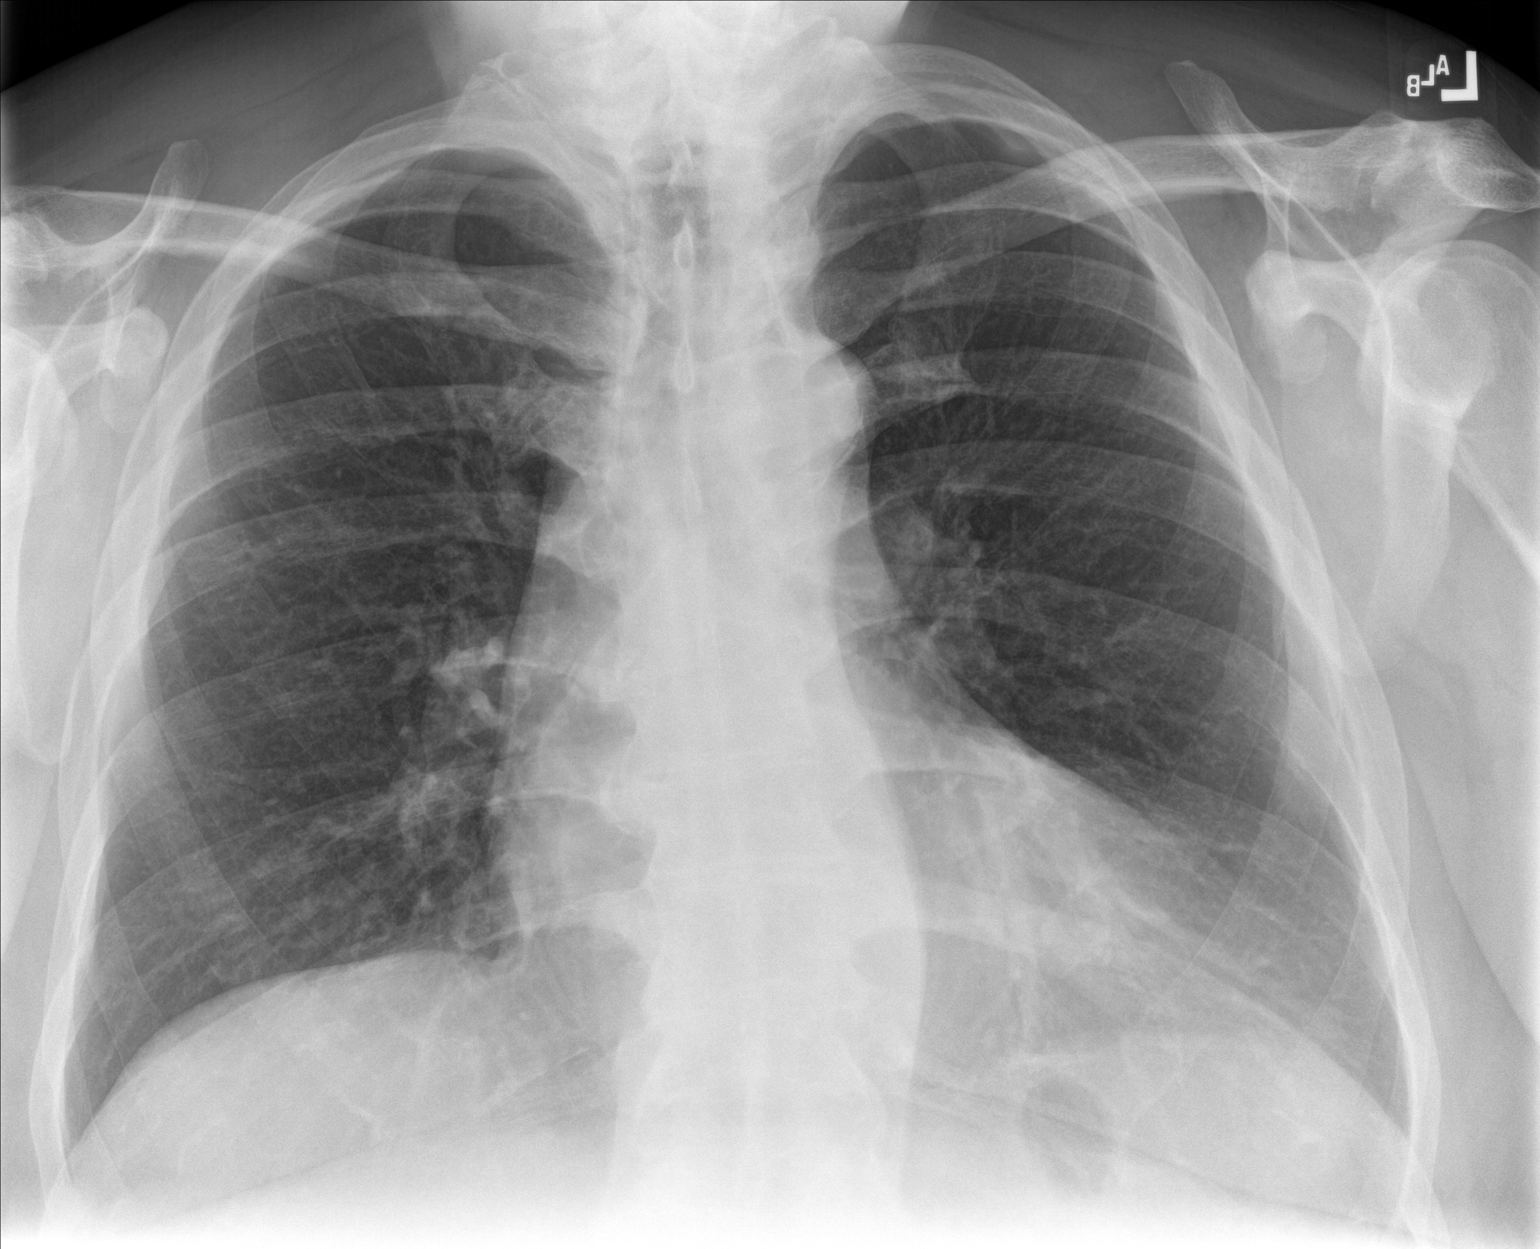

[chest lat]
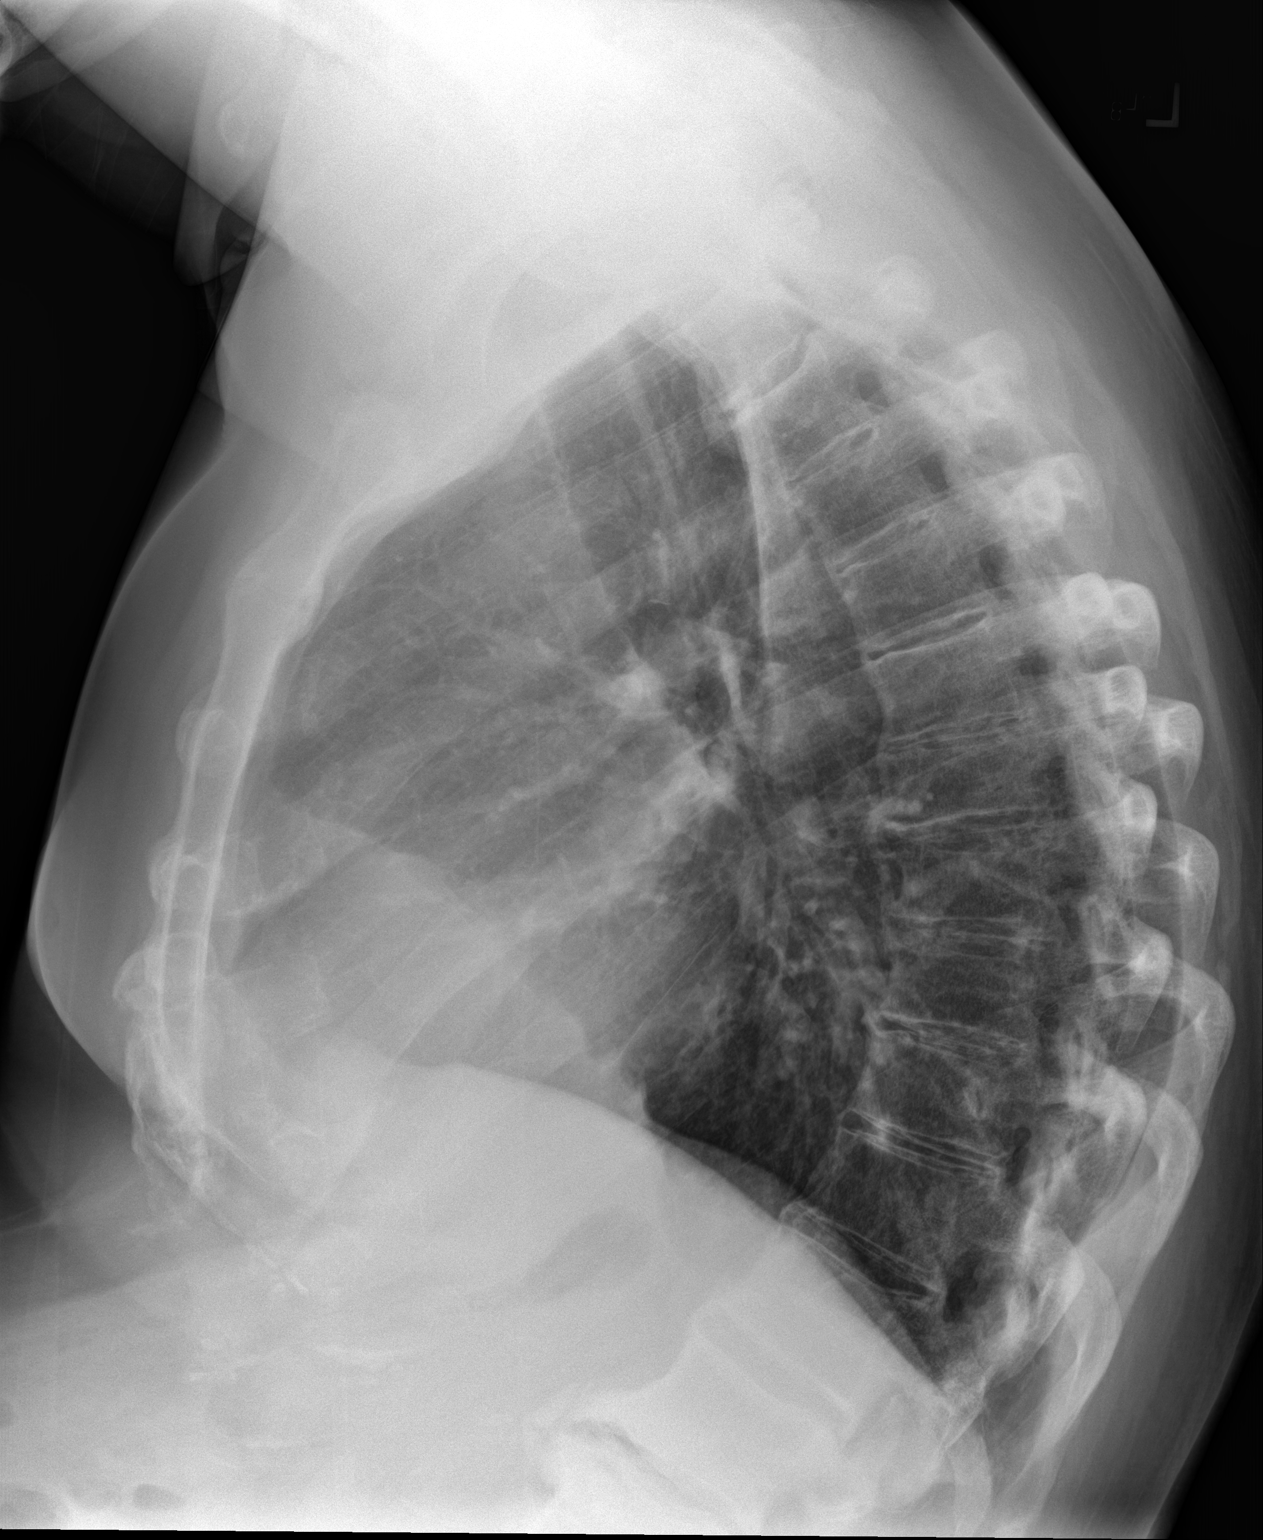

[2 of 2 positions shown; findings below may reference images not displayed]

FINDINGS: The lungs are clear. Heart size and pulmonary vascularity are
normal. No adenopathy. There is degenerative change in the thoracic
spine. There is aortic atherosclerosis.
IMPRESSION: Lungs clear. Cardiac silhouette within normal limits. Aortic
Atherosclerosis ([id number]).

## 2023-05-30 ENCOUNTER — Inpatient Hospital Stay
Admission: EM | Admit: 2023-05-30 | Discharge: 2023-06-03 | DRG: 812 | Disposition: A | Discharge status: Home / Self Care | Attending: Internal Medicine | Admitting: Internal Medicine

## 2023-05-30 ENCOUNTER — Emergency Department

## 2023-05-30 ENCOUNTER — Other Ambulatory Visit: Payer: Self-pay

## 2023-05-30 DIAGNOSIS — Z79890 Hormone replacement therapy (postmenopausal): Secondary | ICD-10-CM

## 2023-05-30 DIAGNOSIS — D62 Acute posthemorrhagic anemia: Secondary | ICD-10-CM | POA: Diagnosis present

## 2023-05-30 DIAGNOSIS — Z823 Family history of stroke: Secondary | ICD-10-CM

## 2023-05-30 DIAGNOSIS — K219 Gastro-esophageal reflux disease without esophagitis: Secondary | ICD-10-CM | POA: Diagnosis present

## 2023-05-30 DIAGNOSIS — E1142 Type 2 diabetes mellitus with diabetic polyneuropathy: Secondary | ICD-10-CM | POA: Diagnosis present

## 2023-05-30 DIAGNOSIS — E871 Hypo-osmolality and hyponatremia: Secondary | ICD-10-CM | POA: Diagnosis not present

## 2023-05-30 DIAGNOSIS — I482 Chronic atrial fibrillation, unspecified: Secondary | ICD-10-CM | POA: Insufficient documentation

## 2023-05-30 DIAGNOSIS — E785 Hyperlipidemia, unspecified: Secondary | ICD-10-CM | POA: Diagnosis present

## 2023-05-30 DIAGNOSIS — Z794 Long term (current) use of insulin: Secondary | ICD-10-CM

## 2023-05-30 DIAGNOSIS — Z87891 Personal history of nicotine dependence: Secondary | ICD-10-CM | POA: Diagnosis not present

## 2023-05-30 DIAGNOSIS — I10 Essential (primary) hypertension: Secondary | ICD-10-CM | POA: Insufficient documentation

## 2023-05-30 DIAGNOSIS — Y92008 Other place in unspecified non-institutional (private) residence as the place of occurrence of the external cause: Secondary | ICD-10-CM

## 2023-05-30 DIAGNOSIS — Z8249 Family history of ischemic heart disease and other diseases of the circulatory system: Secondary | ICD-10-CM | POA: Diagnosis not present

## 2023-05-30 DIAGNOSIS — W07XXXA Fall from chair, initial encounter: Secondary | ICD-10-CM | POA: Diagnosis present

## 2023-05-30 DIAGNOSIS — E039 Hypothyroidism, unspecified: Secondary | ICD-10-CM | POA: Diagnosis present

## 2023-05-30 DIAGNOSIS — E222 Syndrome of inappropriate secretion of antidiuretic hormone: Secondary | ICD-10-CM | POA: Diagnosis present

## 2023-05-30 DIAGNOSIS — E1122 Type 2 diabetes mellitus with diabetic chronic kidney disease: Secondary | ICD-10-CM | POA: Diagnosis present

## 2023-05-30 DIAGNOSIS — Z79899 Other long term (current) drug therapy: Secondary | ICD-10-CM | POA: Diagnosis not present

## 2023-05-30 DIAGNOSIS — W19XXXA Unspecified fall, initial encounter: Secondary | ICD-10-CM | POA: Diagnosis not present

## 2023-05-30 DIAGNOSIS — S8011XA Contusion of right lower leg, initial encounter: Secondary | ICD-10-CM | POA: Diagnosis present

## 2023-05-30 DIAGNOSIS — Z8673 Personal history of transient ischemic attack (TIA), and cerebral infarction without residual deficits: Secondary | ICD-10-CM

## 2023-05-30 DIAGNOSIS — I129 Hypertensive chronic kidney disease with stage 1 through stage 4 chronic kidney disease, or unspecified chronic kidney disease: Secondary | ICD-10-CM | POA: Diagnosis present

## 2023-05-30 DIAGNOSIS — N1832 Chronic kidney disease, stage 3b: Secondary | ICD-10-CM | POA: Insufficient documentation

## 2023-05-30 DIAGNOSIS — E119 Type 2 diabetes mellitus without complications: Secondary | ICD-10-CM | POA: Diagnosis not present

## 2023-05-30 DIAGNOSIS — S300XXA Contusion of lower back and pelvis, initial encounter: Secondary | ICD-10-CM | POA: Diagnosis present

## 2023-05-30 DIAGNOSIS — Z89511 Acquired absence of right leg below knee: Secondary | ICD-10-CM | POA: Diagnosis not present

## 2023-05-30 DIAGNOSIS — Z7984 Long term (current) use of oral hypoglycemic drugs: Secondary | ICD-10-CM | POA: Diagnosis not present

## 2023-05-30 DIAGNOSIS — S7001XA Contusion of right hip, initial encounter: Secondary | ICD-10-CM | POA: Diagnosis not present

## 2023-05-30 DIAGNOSIS — Z96653 Presence of artificial knee joint, bilateral: Secondary | ICD-10-CM | POA: Diagnosis present

## 2023-05-30 DIAGNOSIS — Z7985 Long-term (current) use of injectable non-insulin antidiabetic drugs: Secondary | ICD-10-CM

## 2023-05-30 DIAGNOSIS — G4730 Sleep apnea, unspecified: Secondary | ICD-10-CM | POA: Diagnosis present

## 2023-05-30 DIAGNOSIS — N179 Acute kidney failure, unspecified: Secondary | ICD-10-CM

## 2023-05-30 DIAGNOSIS — D509 Iron deficiency anemia, unspecified: Secondary | ICD-10-CM | POA: Diagnosis present

## 2023-05-30 DIAGNOSIS — Z7901 Long term (current) use of anticoagulants: Secondary | ICD-10-CM

## 2023-05-30 DIAGNOSIS — E876 Hypokalemia: Secondary | ICD-10-CM | POA: Diagnosis present

## 2023-05-30 DIAGNOSIS — S7011XA Contusion of right thigh, initial encounter: Secondary | ICD-10-CM | POA: Diagnosis present

## 2023-05-30 DIAGNOSIS — T148XXA Other injury of unspecified body region, initial encounter: Secondary | ICD-10-CM | POA: Diagnosis not present

## 2023-05-30 DIAGNOSIS — I959 Hypotension, unspecified: Secondary | ICD-10-CM | POA: Diagnosis present

## 2023-05-30 LAB — CBC WITH DIFFERENTIAL/PLATELET
Abs Immature Granulocytes: 0.06 10*3/uL (ref 0.00–0.07)
Basophils Absolute: 0.1 10*3/uL (ref 0.0–0.1)
Basophils Relative: 1 %
Eosinophils Absolute: 0.1 10*3/uL (ref 0.0–0.5)
Eosinophils Relative: 1 %
HCT: 18.7 % — ABNORMAL LOW (ref 39.0–52.0)
Hemoglobin: 6.1 g/dL — ABNORMAL LOW (ref 13.0–17.0)
Immature Granulocytes: 1 %
Lymphocytes Relative: 10 %
Lymphs Abs: 1.2 10*3/uL (ref 0.7–4.0)
MCH: 27.9 pg (ref 26.0–34.0)
MCHC: 32.6 g/dL (ref 30.0–36.0)
MCV: 85.4 fL (ref 80.0–100.0)
Monocytes Absolute: 1 10*3/uL (ref 0.1–1.0)
Monocytes Relative: 8 %
Neutro Abs: 9.8 10*3/uL — ABNORMAL HIGH (ref 1.7–7.7)
Neutrophils Relative %: 79 %
Platelets: 186 10*3/uL (ref 150–400)
RBC: 2.19 MIL/uL — ABNORMAL LOW (ref 4.22–5.81)
RDW: 16.6 % — ABNORMAL HIGH (ref 11.5–15.5)
WBC: 12.3 10*3/uL — ABNORMAL HIGH (ref 4.0–10.5)
nRBC: 0 % (ref 0.0–0.2)

## 2023-05-30 LAB — COMPREHENSIVE METABOLIC PANEL WITH GFR
ALT: 19 U/L (ref 0–44)
AST: 20 U/L (ref 15–41)
Albumin: 2.9 g/dL — ABNORMAL LOW (ref 3.5–5.0)
Alkaline Phosphatase: 28 U/L — ABNORMAL LOW (ref 38–126)
Anion gap: 12 (ref 5–15)
BUN: 79 mg/dL — ABNORMAL HIGH (ref 8–23)
CO2: 23 mmol/L (ref 22–32)
Calcium: 8.3 mg/dL — ABNORMAL LOW (ref 8.9–10.3)
Chloride: 88 mmol/L — ABNORMAL LOW (ref 98–111)
Creatinine, Ser: 3.99 mg/dL — ABNORMAL HIGH (ref 0.61–1.24)
GFR, Estimated: 16 mL/min — ABNORMAL LOW (ref >60)
Glucose, Bld: 109 mg/dL — ABNORMAL HIGH (ref 70–99)
Potassium: 3.8 mmol/L (ref 3.5–5.1)
Sodium: 123 mmol/L — ABNORMAL LOW (ref 135–145)
Total Bilirubin: 0.9 mg/dL (ref 0.0–1.2)
Total Protein: 5.5 g/dL — ABNORMAL LOW (ref 6.5–8.1)

## 2023-05-30 LAB — PREPARE RBC (CROSSMATCH)

## 2023-05-30 LAB — OSMOLALITY: Osmolality: 288 mosm/kg (ref 275–295)

## 2023-05-30 LAB — ABO/RH: ABO/RH(D): A POS

## 2023-05-30 LAB — CK: Total CK: 132 U/L (ref 49–397)

## 2023-05-30 LAB — GLUCOSE, CAPILLARY
Glucose-Capillary: 117 mg/dL — ABNORMAL HIGH (ref 70–99)
Glucose-Capillary: 180 mg/dL — ABNORMAL HIGH (ref 70–99)

## 2023-05-30 MED ORDER — OXYCODONE HCL 5 MG PO TABS: 5.0000 mg | ORAL_TABLET | Freq: Four times a day (QID) | ORAL | Status: AC | PRN

## 2023-05-30 MED ORDER — ANAGRELIDE HCL 0.5 MG PO CAPS
0.5000 mg | ORAL_CAPSULE | Freq: Four times a day (QID) | ORAL | Status: DC
  Administered 2023-05-30 – 2023-06-03 (×14): 0.5 mg via ORAL
  Filled 2023-05-30 (×16): qty 1

## 2023-05-30 MED ORDER — ACETAMINOPHEN 650 MG RE SUPP: 650.0000 mg | Freq: Four times a day (QID) | RECTAL | Status: DC | PRN

## 2023-05-30 MED ORDER — INSULIN ASPART 100 UNIT/ML IJ SOLN
0.0000 [IU] | Freq: Every day | INTRAMUSCULAR | Status: DC
  Administered 2023-05-31: 1 [IU] via SUBCUTANEOUS
  Administered 2023-06-01 – 2023-06-02 (×2): 3 [IU] via SUBCUTANEOUS
  Filled 2023-05-30 (×3): qty 1

## 2023-05-30 MED ORDER — LIOTHYRONINE SODIUM 5 MCG PO TABS
5.0000 ug | ORAL_TABLET | Freq: Every day | ORAL | Status: DC
  Administered 2023-05-31 – 2023-06-03 (×4): 5 ug via ORAL
  Filled 2023-05-30 (×4): qty 1

## 2023-05-30 MED ORDER — ONDANSETRON HCL 4 MG/2ML IJ SOLN: 4.0000 mg | Freq: Four times a day (QID) | INTRAMUSCULAR | Status: DC | PRN

## 2023-05-30 MED ORDER — PANTOPRAZOLE SODIUM 40 MG PO TBEC
40.0000 mg | DELAYED_RELEASE_TABLET | Freq: Every day | ORAL | Status: DC
  Administered 2023-05-30 – 2023-06-02 (×4): 40 mg via ORAL
  Filled 2023-05-30 (×5): qty 1

## 2023-05-30 MED ORDER — MORPHINE SULFATE (PF) 2 MG/ML IV SOLN: 2.0000 mg | INTRAVENOUS | Status: DC | PRN

## 2023-05-30 MED ORDER — HYDROMORPHONE HCL 1 MG/ML IJ SOLN
0.5000 mg | Freq: Once | INTRAMUSCULAR | Status: AC
  Administered 2023-05-30: 0.5 mg via INTRAVENOUS
  Filled 2023-05-30: qty 0.5

## 2023-05-30 MED ORDER — ATORVASTATIN CALCIUM 20 MG PO TABS
10.0000 mg | ORAL_TABLET | Freq: Every day | ORAL | Status: DC
  Administered 2023-05-30 – 2023-06-02 (×4): 10 mg via ORAL
  Filled 2023-05-30 (×5): qty 1

## 2023-05-30 MED ORDER — SENNOSIDES-DOCUSATE SODIUM 8.6-50 MG PO TABS: 1.0000 | ORAL_TABLET | Freq: Every evening | ORAL | Status: DC | PRN

## 2023-05-30 MED ORDER — ACETAMINOPHEN 325 MG PO TABS
650.0000 mg | ORAL_TABLET | Freq: Four times a day (QID) | ORAL | Status: DC | PRN
  Administered 2023-05-31 – 2023-06-01 (×3): 650 mg via ORAL
  Filled 2023-05-30 (×3): qty 2

## 2023-05-30 MED ORDER — METOPROLOL TARTRATE 5 MG/5ML IV SOLN: 5.0000 mg | INTRAVENOUS | Status: DC | PRN

## 2023-05-30 MED ORDER — MORPHINE SULFATE (PF) 4 MG/ML IV SOLN: 4.0000 mg | INTRAVENOUS | Status: DC | PRN

## 2023-05-30 MED ORDER — HYDROMORPHONE HCL 1 MG/ML IJ SOLN: 0.5000 mg | INTRAMUSCULAR | Status: AC | PRN

## 2023-05-30 MED ORDER — SODIUM CHLORIDE 0.9 % IV BOLUS
500.0000 mL | Freq: Once | INTRAVENOUS | Status: AC
  Administered 2023-05-30: 500 mL via INTRAVENOUS

## 2023-05-30 MED ORDER — HYDRALAZINE HCL 20 MG/ML IJ SOLN
5.0000 mg | Freq: Four times a day (QID) | INTRAMUSCULAR | Status: DC | PRN
  Administered 2023-06-03: 5 mg via INTRAVENOUS
  Filled 2023-05-30: qty 1

## 2023-05-30 MED ORDER — SODIUM CHLORIDE 0.9 % IV SOLN: 10.0000 mL/h | Freq: Once | INTRAVENOUS | Status: DC

## 2023-05-30 MED ORDER — ONDANSETRON HCL 4 MG PO TABS: 4.0000 mg | ORAL_TABLET | Freq: Four times a day (QID) | ORAL | Status: DC | PRN

## 2023-05-30 MED ORDER — INSULIN ASPART 100 UNIT/ML IJ SOLN
0.0000 [IU] | Freq: Three times a day (TID) | INTRAMUSCULAR | Status: DC
  Administered 2023-05-31: 1 [IU] via SUBCUTANEOUS
  Administered 2023-05-31: 2 [IU] via SUBCUTANEOUS
  Administered 2023-05-31: 5 [IU] via SUBCUTANEOUS
  Administered 2023-06-01: 2 [IU] via SUBCUTANEOUS
  Administered 2023-06-01: 3 [IU] via SUBCUTANEOUS
  Administered 2023-06-01: 2 [IU] via SUBCUTANEOUS
  Administered 2023-06-02: 3 [IU] via SUBCUTANEOUS
  Administered 2023-06-02: 5 [IU] via SUBCUTANEOUS
  Administered 2023-06-02: 2 [IU] via SUBCUTANEOUS
  Administered 2023-06-03: 3 [IU] via SUBCUTANEOUS
  Filled 2023-05-30 (×10): qty 1

## 2023-05-30 MED ORDER — NEBIVOLOL HCL 10 MG PO TABS
20.0000 mg | ORAL_TABLET | Freq: Every day | ORAL | Status: DC
  Administered 2023-05-31 – 2023-06-03 (×4): 20 mg via ORAL
  Filled 2023-05-30 (×4): qty 2

## 2023-05-30 NOTE — ED Provider Notes (Signed)
 [facility] Provider Note    Event Date/Time   First MD Initiated Contact with Patient 05/30/23 1301     (approximate)   History   Hypotension   HPI  Patrick Sandoval is a 63 year old male presenting to the emergency department for evaluation of weakness. This morning, he felt weak and lightheaded.  His wife took his blood pressure and it was low.  With EMS initial blood pressure 64/40.  He was given a 250cc saline bolus with improvement.  Patient also notes decreased urination recently.  I reviewed documentation from his ER visit at [facility] on 05/27/2023. 3 days ago he fell out of a chair onto his right hip and arm.  He does have a history of A-fib on Xarelto.  He was seen at [facility].  Labs with hemoglobin of 10.7.  CT demonstrated a right gluteal hematoma without evidence of active extravasation.  Surgery was consulted who recommended no intervention but did recommend holding anticoagulation for 3 days.  Patient was discharged.  Since that time, he has noticed increased swelling over his right hip and thigh as well as some ongoing swelling over his right arm.  No new falls.       Physical Exam   Triage Vital Signs: ED Triage Vitals  Encounter Vitals Group     BP 05/30/23 1308 (!) 129/57     Systolic BP Percentile --      Diastolic BP Percentile --      Pulse Rate 05/30/23 1308 91     Resp 05/30/23 1308 15     Temp 05/30/23 1308 97.6 F (36.4 C)     Temp Source 05/30/23 1308 Oral     SpO2 05/30/23 1308 100 %     Weight 05/30/23 1309 262 lb 5.6 oz (119 kg)     Height 05/30/23 1309 5\' 11"  (1.803 m)     Head Circumference --      Peak Flow --      Pain Score 05/30/23 1309 8     Pain Loc --      Pain Education --      Exclude from Growth Chart --     Most recent vital signs: Vitals:   05/30/23 1430 05/30/23 1515  BP: (!) 106/51   Pulse: 86 74  Resp:  19  Temp:    SpO2: 100% 100%   Nursing notes and vital signs reviewed.  General: Adult male,  laying in bed, awake interactive Head: Atraumatic Chest: Symmetric chest rise, no tenderness to palpation.  Cardiac: Regular rhythm and rate.  Respiratory: Lungs clear to auscultation Abdomen: Soft, nondistended. No tenderness to palpation.  Pelvis: Stable in AP and lateral compression. MSK: No deformity to bilateral upper and lower extremity.  There is an area of ecchymosis and hematoma over the right upper extremity.  There is a large area of ecchymosis, swelling, hematoma over the right hip into the right thigh.  See images below.  Compartment is swollen but not tense.  Distal extremity is soft.  Right BKA present. Neuro: Alert, oriented. GCS 15. 5 out of 5 strength in bilateral upper and lower extremities. Normal sensation to light touch in bilateral upper and lower extremity. Skin: No evidence of burns or lacerations.       ED Results / Procedures / Treatments   Labs (all labs ordered are listed, but only abnormal results are displayed) Labs Reviewed  CBC WITH DIFFERENTIAL/PLATELET - Abnormal; Notable for the following components:  Result Value   WBC 12.3 (*)    RBC 2.19 (*)    Hemoglobin 6.1 (*)    HCT 18.7 (*)    RDW 16.6 (*)    Neutro Abs 9.8 (*)    All other components within normal limits  COMPREHENSIVE METABOLIC PANEL WITH GFR - Abnormal; Notable for the following components:   Sodium 123 (*)    Chloride 88 (*)    Glucose, Bld 109 (*)    BUN 79 (*)    Creatinine, Ser 3.99 (*)    Calcium 8.3 (*)    Total Protein 5.5 (*)    Albumin 2.9 (*)    Alkaline Phosphatase 28 (*)    GFR, Estimated 16 (*)    All other components within normal limits  OSMOLALITY  URINALYSIS, ROUTINE W REFLEX MICROSCOPIC  SODIUM, URINE, RANDOM  OSMOLALITY, URINE  CK  TYPE AND SCREEN  PREPARE RBC (CROSSMATCH)  ABO/RH     EKG EKG independently reviewed interpreted by myself (ER attending) demonstrates:    RADIOLOGY Imaging independently reviewed and interpreted by myself  demonstrates:    Formal Radiology Read:  CT ABDOMEN PELVIS WO CONTRAST Result Date: 05/30/2023 CLINICAL DATA:  Right gluteal hematoma. EXAM: CT ABDOMEN AND PELVIS WITHOUT CONTRAST TECHNIQUE: Multidetector CT imaging of the abdomen and pelvis was performed following the standard protocol without IV contrast. RADIATION DOSE REDUCTION: This exam was performed according to the departmental dose-optimization program which includes automated exposure control, adjustment of the mA and/or kV according to patient size and/or use of iterative reconstruction technique. COMPARISON:  None Available. FINDINGS: Lower chest: No acute abnormality. Hepatobiliary: Cholelithiasis. No biliary dilatation. Liver is unremarkable on these unenhanced images. Pancreas: Unremarkable. No pancreatic ductal dilatation or surrounding inflammatory changes. Spleen: Normal in size without focal abnormality. Adrenals/Urinary Tract: Adrenal glands are unremarkable. Kidneys are normal, without renal calculi, focal lesion, or hydronephrosis. Bladder is unremarkable. Stomach/Bowel: Stomach is within normal limits. Appendix appears normal. No evidence of bowel wall thickening, distention, or inflammatory changes. Vascular/Lymphatic: Aortic atherosclerosis. No enlarged abdominal or pelvic lymph nodes. Reproductive: Prostate is unremarkable. Other: Large subcutaneous hematoma is noted in right lateral gluteal and hip region, measuring 15 x 12 x 6 cm. No ascites or hernia is noted. Musculoskeletal: No acute or significant osseous findings. IMPRESSION: 15 x 12 x 6 cm hematoma is noted in subcutaneous tissues in the right lateral gluteal and hip region. Cholelithiasis. Aortic Atherosclerosis (ICD10-I70.0). Electronically Signed   By: Rosalene Colon M.D.   On: 05/30/2023 15:35    PROCEDURES:  Critical Care performed: Yes, see critical care procedure note(s) CRITICAL CARE Performed by: Claria Crofts   Total critical care time: 31 minutes  Critical  care time was exclusive of separately billable procedures and treating other patients.  Critical care was necessary to treat or prevent imminent or life-threatening deterioration.  Critical care was time spent personally by me on the following activities: development of treatment plan with patient and/or surrogate as well as nursing, discussions with consultants, evaluation of patient's response to treatment, examination of patient, obtaining history from patient or surrogate, ordering and performing treatments and interventions, ordering and review of laboratory studies, ordering and review of radiographic studies, pulse oximetry and re-evaluation of patient's condition.   Procedures   MEDICATIONS ORDERED IN ED: Medications  0.9 %  sodium chloride infusion (0 mL/hr Intravenous Hold 05/30/23 1401)  HYDROmorphone (DILAUDID) injection 0.5 mg (0.5 mg Intravenous Given 05/30/23 1434)  sodium chloride 0.9 % bolus 500 mL (500 mLs  Intravenous New Bag/Given 05/30/23 1536)     IMPRESSION / MDM / ASSESSMENT AND PLAN / ED COURSE  I reviewed the triage vital signs and the nursing notes.  Differential diagnosis includes, but is not limited to, acute blood loss anemia secondary to hematoma, electrolyte abnormality, lower suspicion infection based on clinical history  Patient's presentation is most consistent with acute presentation with potential threat to life or bodily function.  63 year old male presenting to the emergency department for evaluation of weakness and lightheadedness.  Hypotensive prior to presentation, but improved blood pressure here.  Labs unfortunately demonstrate significant hemoglobin drop.  Was 10.7 at [facility] 3 days ago, down to 6.1 today.  Mild leukocytosis, possibly reactive.  CMP notable for significant renal injury with creatinine of 3.99, was 1.93 days ago.  Also with drop in sodium at 123, was 133 3 days ago.  Patient ordered to transfuse 1 unit PRBCs.  His imaging from a few days  ago did not demonstrate any active extravasation, but with significant hemoglobin drop did review with Zach Schultz with general surgery who did recommend repeat imaging given significant hemoglobin drop.   Case was discussed with radiologist. He did feel that a CT with IV contrast would be most appropriate imaging to evaluate for active extravasation, however given significant renal injury recommended a noncontrast study to evaluate for any changes in size though this would not be able to evaluate for active extravasation.  With renal injury we will also add on a CK.  Signed out to oncoming physician at 1500 pending completion of workup, anticipated admission.     FINAL CLINICAL IMPRESSION(S) / ED DIAGNOSES   Final diagnoses:  Hematoma of right lower extremity, initial encounter  Acute blood loss anemia  AKI (acute kidney injury) (HCC)     Rx / DC Orders   ED Discharge Orders     None        Note:  This document was prepared using Dragon voice recognition software and may include unintentional dictation errors.   Claria Crofts, MD 05/30/23 [phone]

## 2023-05-30 NOTE — Assessment & Plan Note (Signed)
 -  This complicates overall care and prognosis.

## 2023-05-30 NOTE — Assessment & Plan Note (Signed)
 Baseline hemoglobin is 11.1 from labs in February 2025 Agree with blood transfusion per EDP Goal hemoglobin is > 7

## 2023-05-30 NOTE — Assessment & Plan Note (Addendum)
 Last dose of Xarelto was Sunday We will continue to hold Xarelto on admission AM team can resume when the benefits outweigh the risk

## 2023-05-30 NOTE — Assessment & Plan Note (Signed)
 Resumed home liothyronine 5 mcg daily

## 2023-05-30 NOTE — Hospital Course (Signed)
 Mr. Patrick Sandoval is a 63 year old male with history of CKD 3B, history of atrial fibrillation on Xarelto, hyperlipidemia, neuropathy, insulin-dependent diabetes mellitus, hypothyroid, GERD, hypertension, who presents emergency department for chief concerns of falling outside of church on Sunday.  Vitals in the ED showed temperature of 97.6, respiration rate of 13, heart rate of 97, blood pressure 126/50, SpO2 100% on room air.  Serum sodium is 123, potassium 2.8, chloride 88, bicarb 23, BUN is 79, serum creatinine 3.9, EGFR 16, nonfasting blood glucose 109, WBC 12.3, hemoglobin 6.1, platelets of 186.  ED treatment: Dilaudid 0.5 mg IV one-time dose, sodium chloride 500 mL liter bolus.  Patient was typed green by EDP and 1 unit of blood has been ordered for transfusion.

## 2023-05-30 NOTE — ED Notes (Signed)
 Dressing Xeroform dressing placed on R hip.

## 2023-05-30 NOTE — Assessment & Plan Note (Addendum)
 Home olmesartan-hydrochlorothiazide, furosemide 20 mg daily were not resumed on admission Hydralazine 5 mg IV every 6 hours as needed for SBP greater 165, 5 days ordered

## 2023-05-30 NOTE — ED Triage Notes (Signed)
 Pt to ED via [facility] from home for hypotension. EMS reports that pt had a fall on Sunday and has had decreased urine output since fall. Pt states that he was feeling lightheaded and dizzy this morning. Wife took BP at home and called EMS due to hypotension. EMS reports BP of 64/40. Pt at this time 129/57. Pt denies weakness and dizziness at this time. Pt has bruising to R shoulder and right hip. Reports pain in R shoulder and R hip from fall.   EMS vitals  HR 70 SPO2 97% RA  BP 64/40

## 2023-05-30 NOTE — Assessment & Plan Note (Signed)
 With diffuse ecchymosis Continue to hold Xarelto on admission

## 2023-05-30 NOTE — Assessment & Plan Note (Addendum)
 Home metformin 1000 mg p.o. twice daily, olmesartan-hydrochlorothiazide, furosemide 20 mg daily were not resumed on admission Suspect secondary to blood loss anemia Status post sodium chloride 750 ml bolus per EDP and EMS Will continue with blood transfusion Recheck BMP in the a.m.

## 2023-05-30 NOTE — ED Notes (Signed)
 Pt taken to CT at this time.

## 2023-05-30 NOTE — Assessment & Plan Note (Addendum)
 Patient is on Toujeo 44 units nightly Patient's blood sugar is 102 and 112 on admission therefore long-acting insulin not resumed on admission and in setting of AKI AM team to resume when the benefits outweigh the risk Insulin SSI with at bedtime coverage ordered

## 2023-05-30 NOTE — ED Provider Notes (Signed)
 Care assumed of patient from outgoing provider.  See their note for initial history, exam and plan.  Clinical Course as of 05/30/23 1642  Wed May 30, 2023  1617 Patient presents to the emergency department with generalized weakness.  Patient had a recent hospitalization at [facility] after he had a fall on Sunday.  Patient is on Xarelto for his atrial fibrillation.  During that time he had a CTA that did not show any active extravasation in his pelvis but did have a large hematoma to his right thigh.  Today is now requiring a blood transfusion with a hemoglobin of 6.  Was initially hypotensive at home but blood pressures have remained stable in the emergency department.  Has been holding his Xarelto since Sunday.  Hyponatremia at 123 and significant acute kidney injury from his chronic CKD.  Does appear to have some component of prerenal, BUN is 79.  Significant hyponatremia at 123 -decreased urine output.  Will give a 500 bolus and reevaluate.  Repeat CT scan with enlargement of the hematoma.  Unable to do a contrasted scan given acute kidney injury.  Consulted hospitalist for admission for acute blood loss anemia, acute kidney injury and hyponatremia [SM]    Clinical Course User Index [SM] Viviano Ground, MD     Viviano Ground, MD 05/30/23 [phone]

## 2023-05-30 NOTE — Assessment & Plan Note (Signed)
 Suspect multifactorial in setting of acute stress complicated by acute kidney injury Status post sodium chloride 500 mL liter bolus and sodium chloride 250 bolus per EMS Recheck BMP in the a.m.

## 2023-05-30 NOTE — Assessment & Plan Note (Addendum)
 Home Nebivolol 20 mg daily resumed Metoprolol 5 mg IV every 4 hours as needed for heart rate greater than 120, 3 doses ordered

## 2023-05-30 NOTE — H&P (Addendum)
 History and Physical   Patrick Sandoval ZOX:[id number] DOB: Oct 22, 1960 DOA: 05/30/2023  PCP: Parker Bollard, MD  Patient coming from: home via EMS  I have personally briefly reviewed patient's old medical records in [facility] EMR.  Chief Concern: low blood pressure  HPI: Mr. Patrick Sandoval is a 63 year old male with history of CKD 3B, history of atrial fibrillation on Xarelto, hyperlipidemia, neuropathy, insulin-dependent diabetes mellitus, hypothyroid, GERD, hypertension, who presents emergency department for chief concerns of falling outside of church on Sunday.  Vitals in the ED showed temperature of 97.6, respiration rate of 13, heart rate of 97, blood pressure 126/50, SpO2 100% on room air.  Serum sodium is 123, potassium 2.8, chloride 88, bicarb 23, BUN is 79, serum creatinine 3.9, EGFR 16, nonfasting blood glucose 109, WBC 12.3, hemoglobin 6.1, platelets of 186.  ED treatment: Dilaudid 0.5 mg IV one-time dose, sodium chloride 500 mL liter bolus.  Patient was typed green by EDP and 1 unit of blood has been ordered for transfusion. ------------------------------------------------ At bedside, patient was able to tell me his first and last name, age, location, current calendar year.  Patient reports that he was at church, and he had twisted his prosthetic leg in a way that caused him to fall onto his right side.  He was then seen at [facility] and they discharged him home.  The ED did recommend that patient hold his Xarelto for 3 days.  He reports that today he felt dizzy especially with standing and then his wife checked his blood pressure and then his wife checked his blood pressure and that was found to be low and EMS was called.  He reports no changes to his diet.  He denies further trauma to his person, fever, chills, nausea, vomiting, diarrhea.  He endorses decreased urine output and reports that since Sunday, he has only urinated 4 times.  He reports a decreased urination started  before he even fell on Sunday.  Social history: He lives at home with his wife.  He denies EtOH, recreational drug use, tobacco use.  ROS: Constitutional: no weight change, no fever ENT/Mouth: no sore throat, no rhinorrhea Eyes: no eye pain, no vision changes Cardiovascular: no chest pain, no dyspnea,  no edema, no palpitations Respiratory: no cough, no sputum, no wheezing Gastrointestinal: no nausea, no vomiting, no diarrhea, no constipation Genitourinary: no urinary incontinence, no dysuria, no hematuria Musculoskeletal: no arthralgias, no myalgias Skin: no skin lesions, no pruritus, + ecchymosis, + hematoma Neuro: + weakness, no loss of consciousness, no syncope Psych: no anxiety, no depression, no decrease appetite Heme/Lymph: no bruising, no bleeding  ED Course: Discussed with the EDP, patient requiring hospitalization for chief concerns of multiple issues including acute blood loss anemia, hyponatremia, acute kidney injury.  Assessment/Plan  Principal Problem:   Acute blood loss anemia Active Problems:   Hyponatremia   AKI (acute kidney injury) (HCC)   CKD stage 3b, GFR 30-44 ml/min (HCC)   Atrial fibrillation, chronic (HCC)   On anticoagulant therapy   Insulin dependent type 2 diabetes mellitus (HCC)   Hematoma   Hyperlipidemia   Hypothyroidism   Essential hypertension   Morbid obesity (HCC)   Assessment and Plan:  * Acute blood loss anemia Baseline hemoglobin is 11.1 from labs in February 2025 Agree with blood transfusion per EDP Goal hemoglobin is > 7  AKI (acute kidney injury) (HCC) Home metformin 1000 mg p.o. twice daily, olmesartan-hydrochlorothiazide, furosemide 20 mg daily were not resumed on admission Suspect secondary to blood  loss anemia Status post sodium chloride 750 ml bolus per EDP and EMS Will continue with blood transfusion Recheck BMP in the a.m.  Hyponatremia Suspect multifactorial in setting of acute stress complicated by acute kidney  injury Status post sodium chloride 500 mL liter bolus and sodium chloride 250 bolus per EMS Recheck BMP in the a.m.  Morbid obesity (HCC) This complicates overall care and prognosis.   Essential hypertension Home olmesartan-hydrochlorothiazide, furosemide 20 mg daily were not resumed on admission Hydralazine 5 mg IV every 6 hours as needed for SBP greater 165, 5 days ordered  Hypothyroidism Resumed home liothyronine 5 mcg daily  Hyperlipidemia Atorvastatin 10 mg nightly resumed  Hematoma With diffuse ecchymosis Continue to hold Xarelto on admission  Insulin dependent type 2 diabetes mellitus (HCC) Patient is on Toujeo 44 units nightly Patient's blood sugar is 102 and 112 on admission therefore long-acting insulin not resumed on admission and in setting of AKI AM team to resume when the benefits outweigh the risk Insulin SSI with at bedtime coverage ordered  On anticoagulant therapy Last dose of Xarelto was Sunday We will continue to hold Xarelto on admission AM team can resume when the benefits outweigh the risk  Atrial fibrillation, chronic (HCC) Home Nebivolol 20 mg daily resumed Metoprolol 5 mg IV every 4 hours as needed for heart rate greater than 120, 3 doses ordered  Chart reviewed.   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 His smoking use included cigarettes. He has never used smokeless tobacco. He reports current alcohol use. He reports that he does not currently use drugs.  Allergies  Allergen Reactions   Codeine    Meperidine Other (See Comments)    Mentioned in Pre-check preoperatively   Morphine And Codeine    Vicodin [Hydrocodone -Acetaminophen ]    Prednisone  Nausea Only and Nausea And Vomiting   Family History  Problem Relation Age of Onset   Heart failure Mother    Stroke Father    Cancer Father    Family history: Family history reviewed and not pertinent  Prior to Admission medications   Medication Sig Start Date End Date Taking? Authorizing Provider  anagrelide (AGRYLIN) 0.5 MG capsule Take 0.5 mg by mouth 4 (four) times daily.    [provider]  atorvastatin (LIPITOR) 10 MG tablet Take 10 mg by mouth daily.    [provider]  furosemide (LASIX) 20 MG tablet Take 20 mg by mouth.    [provider]  gabapentin (NEURONTIN) 300 MG capsule Take 300 mg by mouth 3 (three) times daily.    [provider]  insulin aspart (NOVOLOG FLEXPEN) 100 UNIT/ML FlexPen Inject into the skin. 02/03/21 05/04/21  [provider]  insulin detemir (LEVEMIR  FLEXTOUCH) 100 UNIT/ML FlexPen Inject into the skin. 02/03/21 05/04/21  [provider]  insulin glargine, 1 Unit Dial, (TOUJEO SOLOSTAR) 300 UNIT/ML Solostar Pen Inject into the skin. 09/20/20   [provider]  insulin lispro (HUMALOG KWIKPEN) 200 UNIT/ML KwikPen Inject into the skin. 12/12/19   [provider]  INSULIN LISPRO [place] Inject into the skin.    [provider]  LEVEMIR FLEXTOUCH 100 UNIT/ML FlexTouch Pen Inject into the skin. 02/04/21   [provider]  levothyroxine (SYNTHROID) 137 MCG tablet Take 137 mcg by mouth every morning. 04/26/20   [provider]  liothyronine (CYTOMEL) 5 MCG tablet Take 5  mcg by mouth daily.    [provider]  metFORMIN (GLUCOPHAGE) 1000 MG tablet Take 1,000 mg by mouth 2 (two) times daily with a meal.    [provider]  nebivolol (BYSTOLIC) 10 MG tablet Take 20 mg by mouth daily.    [provider]  NOVOLOG FLEXPEN 100 UNIT/ML FlexPen Inject into the skin. 02/04/21   [provider]  olmesartan-hydrochlorothiazide (BENICAR HCT) 40-25 MG tablet Take 1 tablet by mouth daily.    [provider]  omeprazole (PRILOSEC) 20 MG capsule Take 20 mg by mouth daily.    [provider]  rivaroxaban (XARELTO) 20 MG TABS tablet Take 20 mg by mouth daily with supper.    [provider]  Spacer/Aero-Holding Chambers (AEROCHAMBER MV) inhaler Use as instructed 05/13/20   Kent Pear, NP  TOUJEO SOLOSTAR 300 UNIT/ML Solostar Pen SMARTSIG:100 Unit(s) SUB-Q Every Morning 09/21/20   [provider]  TRULICITY 1.5 MG/0.5ML SOPN Inject into the skin. 11/20/19   [provider]  zolpidem (AMBIEN) 10 MG tablet Take 10 mg by mouth at bedtime as needed for sleep.  05/13/20  [provider]   Physical Exam: Vitals:   05/30/23 1630 05/30/23 1700 05/30/23 1804 05/30/23 1903  BP: (!) 101/59 (!) 106/58 105/81 (!) 105/56  Pulse: 78  84 92  Resp: 11 19 18 18   Temp:   (!) 97.3 F (36.3 C) 97.6 F (36.4 C)  TempSrc:    Oral  SpO2: 100% 100% 100% 100%  Weight:      Height:       Constitutional: appears age-appropriate, NAD, calm Eyes: PERRL, lids and conjunctivae normal ENMT: Mucous membranes are moist. Posterior pharynx clear of any exudate or lesions. Age-appropriate dentition. Hearing appropriate Neck: normal, supple, no masses, no thyromegaly Respiratory: clear to auscultation bilaterally, no wheezing, no crackles. Normal respiratory effort. No accessory muscle use.  Cardiovascular: Regular rate and rhythm, no murmurs / rubs / gallops. No extremity edema. 2+ pedal pulses. No carotid bruits.   Abdomen: Morbidly obese abdomen, no tenderness, no masses palpated, no hepatosplenomegaly. Bowel sounds positive.  Musculoskeletal: no clubbing / cyanosis. No joint deformity upper and lower extremities. Good ROM, no contractures, no atrophy. Normal muscle tone.  Skin: Ecchymosis in the right upper extremity and    the right lower extremity  Neurologic: Sensation intact. Strength 5/5 in all 4.  Psychiatric: Normal judgment and insight. Alert and oriented x 3. Normal mood.   EKG: independently reviewed, showing atrial fibrillation with rate of 89, QTc 445  Chest x-ray on Admission: I personally reviewed and I agree with radiologist reading as below.  CT ABDOMEN PELVIS WO CONTRAST Result Date: 05/30/2023 CLINICAL DATA:  Right gluteal hematoma. EXAM: CT ABDOMEN AND PELVIS WITHOUT CONTRAST TECHNIQUE: Multidetector CT imaging of the abdomen and pelvis was performed following the standard protocol  without IV contrast. RADIATION DOSE REDUCTION: This exam was performed according to the departmental dose-optimization program which includes automated exposure control, adjustment of the mA and/or kV according to patient size and/or use of iterative reconstruction technique. COMPARISON:  None Available. FINDINGS: Lower chest: No acute abnormality. Hepatobiliary: Cholelithiasis. No biliary dilatation. Liver is unremarkable on these unenhanced images. Pancreas: Unremarkable. No pancreatic ductal dilatation or surrounding inflammatory changes. Spleen: Normal in size without focal abnormality. Adrenals/Urinary Tract: Adrenal glands are unremarkable. Kidneys are normal, without renal calculi, focal lesion, or hydronephrosis. Bladder is unremarkable. Stomach/Bowel: Stomach is within normal limits. Appendix appears normal. No evidence of bowel wall thickening, distention, or inflammatory changes. Vascular/Lymphatic: Aortic atherosclerosis. No enlarged abdominal or pelvic lymph nodes. Reproductive: Prostate is  unremarkable. Other: Large subcutaneous hematoma is noted in right lateral gluteal and hip region, measuring 15 x 12 x 6 cm. No ascites or hernia is noted. Musculoskeletal: No acute or significant osseous findings. IMPRESSION: 15 x 12 x 6 cm hematoma is noted in subcutaneous tissues in the right lateral gluteal and hip region. Cholelithiasis. Aortic Atherosclerosis (ICD10-I70.0). Electronically Signed   By: Rosalene Colon M.D.   On: 05/30/2023 15:35   Labs on Admission: I have personally reviewed following labs  CBC: Recent Labs  Lab 05/30/23 1314  WBC 12.3*  NEUTROABS 9.8*  HGB 6.1*  HCT 18.7*  MCV 85.4  PLT 186   Basic Metabolic Panel: Recent Labs  Lab 05/30/23 1314  NA 123*  K 3.8  CL 88*  CO2 23  GLUCOSE 109*  BUN 79*  CREATININE 3.99*  CALCIUM 8.3*   GFR: Estimated Creatinine Clearance: 24.6 mL/min (A) (by C-G formula based on SCr of 3.99 mg/dL (H)).  Liver Function Tests: Recent Labs  Lab 05/30/23 1314  AST 20  ALT 19  ALKPHOS 28*  BILITOT 0.9  PROT 5.5*  ALBUMIN 2.9*   Cardiac Enzymes: Recent Labs  Lab 05/30/23 1314  CKTOTAL 132   Urine analysis:    Component Value Date/Time   COLORURINE YELLOW 11/26/2021 1338   APPEARANCEUR CLEAR 11/26/2021 1338   LABSPEC <1.005 (L) 11/26/2021 1338   PHURINE 5.0 11/26/2021 1338   GLUCOSEU NEGATIVE 11/26/2021 1338   HGBUR MODERATE (A) 11/26/2021 1338   BILIRUBINUR NEGATIVE 11/26/2021 1338   KETONESUR NEGATIVE 11/26/2021 1338   PROTEINUR NEGATIVE 11/26/2021 1338   NITRITE NEGATIVE 11/26/2021 1338   LEUKOCYTESUR NEGATIVE 11/26/2021 1338   This document was prepared using Dragon Voice Recognition software and may include unintentional dictation errors.  Dr. Reinhold Carbine [facility]  If 7PM-7AM, please contact overnight-coverage provider If 7AM-7PM, please contact day attending provider [email or web address]  05/30/2023, 7:28 PM

## 2023-05-30 NOTE — Assessment & Plan Note (Signed)
 -  Atorvastatin 10 mg nightly resumed

## 2023-05-31 ENCOUNTER — Inpatient Hospital Stay

## 2023-05-31 DIAGNOSIS — N179 Acute kidney failure, unspecified: Secondary | ICD-10-CM

## 2023-05-31 DIAGNOSIS — E871 Hypo-osmolality and hyponatremia: Secondary | ICD-10-CM | POA: Diagnosis not present

## 2023-05-31 DIAGNOSIS — Z794 Long term (current) use of insulin: Secondary | ICD-10-CM

## 2023-05-31 DIAGNOSIS — D62 Acute posthemorrhagic anemia: Secondary | ICD-10-CM | POA: Diagnosis not present

## 2023-05-31 DIAGNOSIS — W19XXXA Unspecified fall, initial encounter: Secondary | ICD-10-CM | POA: Diagnosis not present

## 2023-05-31 DIAGNOSIS — E119 Type 2 diabetes mellitus without complications: Secondary | ICD-10-CM

## 2023-05-31 DIAGNOSIS — T148XXA Other injury of unspecified body region, initial encounter: Secondary | ICD-10-CM | POA: Diagnosis not present

## 2023-05-31 DIAGNOSIS — S7001XA Contusion of right hip, initial encounter: Secondary | ICD-10-CM

## 2023-05-31 DIAGNOSIS — S8011XA Contusion of right lower leg, initial encounter: Secondary | ICD-10-CM

## 2023-05-31 DIAGNOSIS — Z7901 Long term (current) use of anticoagulants: Secondary | ICD-10-CM

## 2023-05-31 DIAGNOSIS — N1832 Chronic kidney disease, stage 3b: Secondary | ICD-10-CM

## 2023-05-31 LAB — IRON AND TIBC
Iron: 32 ug/dL — ABNORMAL LOW (ref 45–182)
Saturation Ratios: 12 % — ABNORMAL LOW (ref 17.9–39.5)
TIBC: 260 ug/dL (ref 250–450)
UIBC: 228 ug/dL

## 2023-05-31 LAB — GLUCOSE, CAPILLARY
Glucose-Capillary: 132 mg/dL — ABNORMAL HIGH (ref 70–99)
Glucose-Capillary: 253 mg/dL — ABNORMAL HIGH (ref 70–99)
Glucose-Capillary: 191 mg/dL — ABNORMAL HIGH (ref 70–99)
Glucose-Capillary: 215 mg/dL — ABNORMAL HIGH (ref 70–99)
Glucose-Capillary: 189 mg/dL — ABNORMAL HIGH (ref 70–99)

## 2023-05-31 LAB — BASIC METABOLIC PANEL WITH GFR
Anion gap: 10 (ref 5–15)
BUN: 73 mg/dL — ABNORMAL HIGH (ref 8–23)
CO2: 25 mmol/L (ref 22–32)
Calcium: 8.4 mg/dL — ABNORMAL LOW (ref 8.9–10.3)
Chloride: 92 mmol/L — ABNORMAL LOW (ref 98–111)
Creatinine, Ser: 2.89 mg/dL — ABNORMAL HIGH (ref 0.61–1.24)
GFR, Estimated: 24 mL/min — ABNORMAL LOW (ref >60)
Glucose, Bld: 111 mg/dL — ABNORMAL HIGH (ref 70–99)
Potassium: 4.2 mmol/L (ref 3.5–5.1)
Sodium: 127 mmol/L — ABNORMAL LOW (ref 135–145)

## 2023-05-31 LAB — CBC
HCT: 20.8 % — ABNORMAL LOW (ref 39.0–52.0)
Hemoglobin: 6.9 g/dL — ABNORMAL LOW (ref 13.0–17.0)
MCH: 27.9 pg (ref 26.0–34.0)
MCHC: 33.2 g/dL (ref 30.0–36.0)
MCV: 84.2 fL (ref 80.0–100.0)
Platelets: 170 10*3/uL (ref 150–400)
RBC: 2.47 MIL/uL — ABNORMAL LOW (ref 4.22–5.81)
RDW: 15.8 % — ABNORMAL HIGH (ref 11.5–15.5)
WBC: 9.8 10*3/uL (ref 4.0–10.5)
nRBC: 0 % (ref 0.0–0.2)

## 2023-05-31 LAB — HEMOGLOBIN: Hemoglobin: 7 g/dL — ABNORMAL LOW (ref 13.0–17.0)

## 2023-05-31 LAB — URINALYSIS, ROUTINE W REFLEX MICROSCOPIC
Bilirubin Urine: NEGATIVE
Glucose, UA: NEGATIVE mg/dL
Ketones, ur: NEGATIVE mg/dL
Leukocytes,Ua: NEGATIVE
Nitrite: NEGATIVE
Protein, ur: NEGATIVE mg/dL
Specific Gravity, Urine: 1.008 (ref 1.005–1.030)
Squamous Epithelial / HPF: 0 /HPF (ref 0–5)
pH: 6 (ref 5.0–8.0)

## 2023-05-31 LAB — CK: Total CK: 88 U/L (ref 49–397)

## 2023-05-31 LAB — SODIUM, URINE, RANDOM: Sodium, Ur: 32 mmol/L

## 2023-05-31 LAB — RETIC PANEL
Immature Retic Fract: 22.3 % — ABNORMAL HIGH (ref 2.3–15.9)
RBC.: 2.46 MIL/uL — ABNORMAL LOW (ref 4.22–5.81)
Retic Count, Absolute: 66.7 10*3/uL (ref 19.0–186.0)
Retic Ct Pct: 2.7 % (ref 0.4–3.1)
Reticulocyte Hemoglobin: 29.2 pg (ref >27.9)

## 2023-05-31 LAB — FERRITIN: Ferritin: 275 ng/mL (ref 24–336)

## 2023-05-31 LAB — HEMOGLOBIN A1C
Hgb A1c MFr Bld: 5.5 % (ref 4.8–5.6)
Mean Plasma Glucose: 111.15 mg/dL

## 2023-05-31 LAB — PREPARE RBC (CROSSMATCH)

## 2023-05-31 LAB — OSMOLALITY, URINE: Osmolality, Ur: 223 mosm/kg — ABNORMAL LOW (ref 300–900)

## 2023-05-31 MED ORDER — ORAL CARE MOUTH RINSE: 15.0000 mL | OROMUCOSAL | Status: DC | PRN

## 2023-05-31 MED ORDER — FERROUS SULFATE 325 (65 FE) MG PO TABS
325.0000 mg | ORAL_TABLET | Freq: Every day | ORAL | Status: DC
  Administered 2023-06-01 – 2023-06-03 (×3): 325 mg via ORAL
  Filled 2023-05-31 (×3): qty 1

## 2023-05-31 MED ORDER — GABAPENTIN 300 MG PO CAPS
600.0000 mg | ORAL_CAPSULE | Freq: Three times a day (TID) | ORAL | Status: DC
  Administered 2023-05-31 – 2023-06-03 (×9): 600 mg via ORAL
  Filled 2023-05-31 (×9): qty 2

## 2023-05-31 MED ORDER — SODIUM CHLORIDE 0.9% IV SOLUTION: Freq: Once | INTRAVENOUS | Status: DC

## 2023-05-31 NOTE — Progress Notes (Addendum)
 Progress Note   Patient: Patrick Sandoval AVW:[id number] DOB: 1960/04/21 DOA: 05/30/2023     1 DOS: the patient was seen and examined on 05/31/2023   Brief hospital course:  62yo hx of L BKA, afib on xarelto, presented to [facility] 4/27 with fall and right hip hematoma.  CTA at the time showed now bleeding, was told to stop xarelto.  Shows up here with hypotension, hgb 6, and larger hematoma via CT.  Been off Xarelto since [facility].  Assessment and Plan:  Acute blood loss anemia - Hemoglobin at [facility] 4/27 was 10.7.  On presentation here, 6.1.  Improved to 6.9 after 1 unit PRBCs.  Will order 1 more unit today.  Recheck hemoglobin later this afternoon and in AM.  Etiology likely from bleeding hematoma.  Holding Xarelto.  Iron studies showing iron deficiency.  Will place on p.o. iron supplementation as well.  Acute right thigh hematoma (POA) - Diagnosed at [facility] 4/27 with CT angio showed no bleeding, hematoma size 5.8 cm x 11 cm.  Repeat CT here shows increase in size to 15 x 12 x 6 cm.  Noted ecchymosis and weeping blood with tenderness.  Unable to repeat CT angio given AKI.  Will consult general surgery to assess whether surgical invention is warranted over conservative management.  Acute kidney injury on CKD 3B - Likely prerenal etiology given patient's anemia.  Creatinine on presentation 3.99 (previous baseline 1.9 at [facility] 4/27).  Showing improvement this morning, creatinine 2.89.  Will recheck BMP and magnesium in AM.  Monitor urine output.  Hypokalemia - Likely from AKI, decreased p.o. intake.  Serum sodium showing mild improvement after IV fluids and PRBC, from 123 on presentation to 127.  Insulin -dependent diabetes mellitus - Insulin  sliding scale on board.  Atrial fibrillation - Continues on telemetry.  Holding Xarelto.  Continue Nebivolol .  Appears to be rate controlled at this time.  Struct of sleep apnea - Ordered CPAP for bedtime   Subjective: Patient feeling slightly improved this  morning.  Status post 1 unit PRBCs.  Still admits to extreme tenderness in his right hip as well as some weeping/blistering of blood.  Ecchymosis prominent.  Denies any fever, shortness of breath, chest pain, nausea, vomiting, abdominal pain.  Confirms that he has been off Xarelto since his visit to [facility] 4/27.  Physical Exam: Vitals:   05/31/23 0459 05/31/23 0750 05/31/23 1023 05/31/23 1041  BP: 116/68 116/86 120/69 112/67  Pulse: 70 85 95 85  Resp: 18 16 16 16   Temp: 97.7 F (36.5 C) 97.7 F (36.5 C) 97.7 F (36.5 C) 98 F (36.7 C)  TempSrc:   Oral Oral  SpO2: 100% 100% 100% 100%  Weight:      Height:        GENERAL:  Alert, pleasant, no acute distress  HEENT:  EOMI CARDIOVASCULAR:  RRR, no murmurs appreciated RESPIRATORY:  Clear to auscultation, no wheezing, rales, or rhonchi GASTROINTESTINAL:  Soft, nontender, nondistended EXTREMITIES: Edema and tenderness in right hip NEURO: No new focal deficit appreciated SKIN:  Scattered ecchymosis, frank ecchymosis in right hip with weeping serosanguineous discharge PSYCH:  Appropriate mood and affect, anxious   Data Reviewed:  There are no new results to review at this time.  Labs: CBC: Recent Labs  Lab 05/30/23 1314 05/31/23 0550  WBC 12.3* 9.8  NEUTROABS 9.8*  --   HGB 6.1* 6.9*  HCT 18.7* 20.8*  MCV 85.4 84.2  PLT 186 170   Basic Metabolic Panel: Recent Labs  Lab 05/30/23  1314 05/31/23 0550  NA 123* 127*  K 3.8 4.2  CL 88* 92*  CO2 23 25  GLUCOSE 109* 111*  BUN 79* 73*  CREATININE 3.99* 2.89*  CALCIUM  8.3* 8.4*   Liver Function Tests: Recent Labs  Lab 05/30/23 1314  AST 20  ALT 19  ALKPHOS 28*  BILITOT 0.9  PROT 5.5*  ALBUMIN 2.9*   CBG: Recent Labs  Lab 05/30/23 1819 05/30/23 2142 05/31/23 0752  GLUCAP 117* 180* 132*    Scheduled Meds:  sodium chloride    Intravenous Once   anagrelide   0.5 mg Oral QID   atorvastatin   10 mg Oral QHS   insulin  aspart  0-5 Units Subcutaneous QHS   insulin   aspart  0-9 Units Subcutaneous TID WC   liothyronine   5 mcg Oral Daily   nebivolol   20 mg Oral Daily   pantoprazole   40 mg Oral QHS   Continuous Infusions:  sodium chloride  Stopped (05/30/23 1401)   PRN Meds:.acetaminophen  **OR** acetaminophen , hydrALAZINE , HYDROmorphone  (DILAUDID ) injection, metoprolol  tartrate, ondansetron  **OR** ondansetron  (ZOFRAN ) IV, mouth rinse, oxyCODONE , senna-docusate  Family Communication: None at bedside  Disposition: Status is: Inpatient Remains inpatient appropriate because: Anemia requiring transfusion     Time spent: 55 minutes  Author: Jodeane Mulligan, DO 05/31/2023 12:02 PM  For on call review [email or web address].

## 2023-05-31 NOTE — Progress Notes (Signed)
 Patient ordered on CPAP at night. Patient stated "doesn't wear cpap at home and doesn't need one here".

## 2023-05-31 NOTE — Plan of Care (Signed)
 Patrick Sandoval

## 2023-05-31 NOTE — Hospital Course (Addendum)
 62yo hx of L BKA, afib on xarelto, presented to [facility] 4/27 with fall and right hip hematoma.  CTA at the time showed now bleeding, was told to stop xarelto.  Shows up here with hypotension, hgb 6, and larger hematoma via CT.  Been off Xarelto since [facility].   Assessment and Plan:   Acute blood loss anemia - Hemoglobin at [facility] 4/27 was 10.7.  On presentation here, 6.1.  S/p 2 units PRBCs.  Recheck hemoglobin stable around 7.  Just on the threshold of transfusion.  Will continue to trend hemoglobin every 6 hours.  Will consider transfusion if hemoglobin less than 7 or patient symptomatic.  Etiology likely from bleeding hematoma.  Holding Xarelto.  Iron studies showing iron deficiency.  Continues on p.o. iron supplementation as well.  Ideally will discharge home if hemoglobin is stable or trending upward.  Anticipate discharge later today or tomorrow.   Acute right thigh hematoma (POA) - Diagnosed at [facility] 4/27 with CT angio showed no bleeding, hematoma size 5.8 cm x 11 cm.  Repeat CT here shows possible increase in size to 15 x 12 x 6 cm.  Personally reviewed imaging from [facility] comparison to new imaging here showing hematoma mostly stable.  Unable to repeat CT angio given AKI.  General surgery consulted to evaluate if surgical intervention necessary.  At this time no need for surgical intervention, general surgery signed off.  Vascular consult placed as well.   Acute kidney injury on CKD 3B - Likely prerenal etiology given patient's anemia.  Creatinine on presentation 3.99 (previous baseline 1.9 at [facility] 4/27).  Continue showing slow improvement, creatinine 1.89 this morning.  Will recheck BMP and magnesium in AM.  Monitor urine output.   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 intake.  Serum sodium had initially showed mild improvement after IV fluids and PRBC, from 123 on presentation to 127, then back down to 123.  Urine osmole inappropriately low suggesting possible SIADH.  Responding  well to urea  powder twice daily.  Sodium up to 129 this morning.  Will recheck BMP in AM.   Insulin -dependent diabetes mellitus - Insulin  sliding scale on board.   Atrial fibrillation - Continues on telemetry.  Holding Xarelto.  Continue Nebivolol .  Appears to be rate controlled at this time.   Struct of sleep apnea - Ordered CPAP for bedtime however patient declined.

## 2023-05-31 NOTE — Consult Note (Signed)
 Date of Consultation:  05/31/2023  Requesting Physician:  Pandora Bogaert, DO  Reason for Consultation:  Right hip hematoma  History of Present Illness: Patrick Sandoval is a 63 y.o. male admitted yesterday to [facility] with hypotension and acute blood loss anemia.  The patient suffered a fall on 05/27/2023 and landed on his right side.  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  Presented to [facility] after his fall and was noted to have a hematoma in the right hip area.  CTA scan showed a 5.8 x 11 cm hematoma within the right gluteal subcutaneous fat.  There was no evidence of active extravasation at the time.  His hemoglobin remained relatively stable initially being 10.7 on presentation and 10 few hours later.  He was advised to stop his Xarelto and was discharged home.  The patient then presented to our emergency room yesterday feeling weak and lightheaded and looking very pale.  His blood pressure was very low at 64/40.  His repeat hemoglobin had gone down to 6.1.  He was given transfusion of 2 units of red blood cells.  He had a repeat CT scan which showed a 15 x 12 x 6 cm hematoma.  This was without contrast due to AKI with a creatinine of 3.99.  He was admitted to the hospitalist for further management.  His hemoglobin today has been stable with 6.9 this morning and 7.0 this afternoon.  He reports soreness in the right hip area with a lot of tightness due to the hematoma and an area of blistering on the side of the upper thigh on the right side.  Past Medical History: Past Medical History:  Diagnosis Date   Arthritis    Atrial fibrillation (HCC)    Diabetes mellitus without complication (HCC)    Hypertension    Pituitary tumor    Stroke [facility])    Thyroid disease      Past Surgical History: Past Surgical History:  Procedure Laterality Date   bilteral rotator      EYE SURGERY Bilateral    LEG AMPUTATION BELOW KNEE Right  11/08/2021   PITUITARY SURGERY     REPLACEMENT TOTAL KNEE BILATERAL     TONSILLECTOMY      Home Medications: Prior to Admission medications   Medication Sig Start Date End Date Taking? Authorizing Provider  anagrelide  (AGRYLIN) 0.5 MG capsule Take 0.5 mg by mouth 4 (four) times daily.    [provider]  atorvastatin  (LIPITOR) 10 MG tablet Take 10 mg by mouth daily.    [provider]  furosemide (LASIX) 20 MG tablet Take 20 mg by mouth.    [provider]  gabapentin  (NEURONTIN ) 300 MG capsule Take 300 mg by mouth 3 (three) times daily.    [provider]  insulin  aspart (NOVOLOG  FLEXPEN) 100 UNIT/ML FlexPen Inject into the skin. 02/03/21 05/04/21  [provider]  insulin  detemir (LEVEMIR FLEXTOUCH) 100 UNIT/ML FlexPen Inject into the skin. 02/03/21 05/04/21  [provider]  insulin  glargine, 1 Unit Dial, (TOUJEO SOLOSTAR) 300 UNIT/ML Solostar Pen Inject into the skin. 09/20/20   [provider]  insulin  lispro (HUMALOG KWIKPEN) 200 UNIT/ML KwikPen Inject into the skin. 12/12/19   [provider]  INSULIN  LISPRO [place] Inject into the skin.    [provider]  LEVEMIR FLEXTOUCH 100 UNIT/ML FlexTouch Pen Inject into the skin. 02/04/21   [provider]  levothyroxine (SYNTHROID) 137 MCG tablet Take  137 mcg by mouth every morning. 04/26/20   [provider]  liothyronine  (CYTOMEL ) 5 MCG tablet Take 5 mcg by mouth daily.    [provider]  metFORMIN (GLUCOPHAGE) 1000 MG tablet Take 1,000 mg by mouth 2 (two) times daily with a meal.    [provider]  nebivolol  (BYSTOLIC ) 10 MG tablet Take 20 mg by mouth daily.    [provider]  NOVOLOG  FLEXPEN 100 UNIT/ML FlexPen Inject into the skin. 02/04/21   [provider]  olmesartan-hydrochlorothiazide (BENICAR HCT) 40-25 MG tablet Take 1 tablet by mouth daily.    [provider]  omeprazole (PRILOSEC) 20 MG capsule Take 20  mg by mouth daily.    [provider]  rivaroxaban (XARELTO) 20 MG TABS tablet Take 20 mg by mouth daily with supper.    [provider]  Spacer/Aero-Holding Chambers (AEROCHAMBER MV) inhaler Use as instructed 05/13/20   Kent Pear, NP  TOUJEO SOLOSTAR 300 UNIT/ML Solostar Pen SMARTSIG:100 Unit(s) SUB-Q Every Morning 09/21/20   [provider]  TRULICITY 1.5 MG/0.5ML SOPN Inject into the skin. 11/20/19   [provider]  zolpidem (AMBIEN) 10 MG tablet Take 10 mg by mouth at bedtime as needed for sleep.  05/13/20  [provider]    Allergies: Allergies  Allergen Reactions   Codeine    Meperidine Other (See Comments)    Mentioned in Pre-check preoperatively   Morphine  And Codeine    Vicodin [Hydrocodone -Acetaminophen ]    Prednisone  Nausea Only and Nausea And Vomiting    Social History:  reports that he quit smoking about 15 years ago. His smoking use included cigarettes. He has never used smokeless tobacco. He reports current alcohol use. He reports that he does not currently use drugs.   Family History: Family History  Problem Relation Age of Onset   Heart failure Mother    Stroke Father    Cancer Father     Review of Systems: Review of Systems  Constitutional:  Negative for chills and fever.  Respiratory:  Negative for shortness of breath.   Cardiovascular:  Negative for chest pain.  Gastrointestinal:  Negative for nausea and vomiting.  Genitourinary:  Negative for dysuria.  Musculoskeletal:  Positive for myalgias.       Right thigh and gluteal pain/soreness.  Skin:        Blistering skin right thigh  Neurological:  Negative for dizziness.  Psychiatric/Behavioral:  Negative for depression.     Physical Exam BP (!) 106/41 (BP Location: Left Arm)   Pulse 91   Temp 97.9 F (36.6 C) (Oral)   Resp 16   Ht 5\' 11"  (1.803 m)   Wt 119 kg   SpO2 100%   BMI 36.59 kg/m  CONSTITUTIONAL: No acute distress HEENT:  Normocephalic,  atraumatic, extraocular motion intact. RESPIRATORY:  Normal respiratory effort without pathologic use of accessory muscles. CARDIOVASCULAR: Irregular rhythm, rate controlled MUSCULOSKELETAL:  Normal muscle strength and tone in all four extremities.  No peripheral edema or cyanosis. SKIN: Patient has a large area of ecchymosis in the right thigh extending from above the knee to the junction with the groin and wrapping circumferentially to the right buttocks.  The deeper ecchymosis is in the dependent portion which is the right buttocks area.  On the side of the right thigh, the patient has about a centimeter area of skin blistering currently covered with a foam dressing.  No evidence of infection at this point. NEUROLOGIC:  Motor and sensation is grossly  normal.  Cranial nerves are grossly intact. PSYCH:  Alert and oriented to person, place and time. Affect is normal.  Laboratory Analysis: Results for orders placed or performed during the hospital encounter of 05/30/23 (from the past 24 hours)  Glucose, capillary     Status: Abnormal   Collection Time: 05/30/23  6:19 PM  Result Value Ref Range   Glucose-Capillary 117 (H) 70 - 99 mg/dL  Glucose, capillary     Status: Abnormal   Collection Time: 05/30/23  9:42 PM  Result Value Ref Range   Glucose-Capillary 180 (H) 70 - 99 mg/dL  Urinalysis, Routine w reflex microscopic -Urine, Clean Catch     Status: Abnormal   Collection Time: 05/31/23  1:00 AM  Result Value Ref Range   Color, Urine STRAW (A) YELLOW   APPearance CLEAR (A) CLEAR   Specific Gravity, Urine 1.008 1.005 - 1.030   pH 6.0 5.0 - 8.0   Glucose, UA NEGATIVE NEGATIVE mg/dL   Hgb urine dipstick SMALL (A) NEGATIVE   Bilirubin Urine NEGATIVE NEGATIVE   Ketones, ur NEGATIVE NEGATIVE mg/dL   Protein, ur NEGATIVE NEGATIVE mg/dL   Nitrite NEGATIVE NEGATIVE   Leukocytes,Ua NEGATIVE NEGATIVE   RBC / HPF 0-5 0 - 5 RBC/hpf   WBC, UA 0-5 0 - 5 WBC/hpf   Bacteria, UA RARE (A) NONE SEEN    Squamous Epithelial / HPF 0 0 - 5 /HPF  Sodium, urine, random     Status: None   Collection Time: 05/31/23  1:00 AM  Result Value Ref Range   Sodium, Ur 32 mmol/L  Osmolality, urine     Status: Abnormal   Collection Time: 05/31/23  1:00 AM  Result Value Ref Range   Osmolality, Ur 223 (L) 300 - 900 mOsm/kg  Basic metabolic panel     Status: Abnormal   Collection Time: 05/31/23  5:50 AM  Result Value Ref Range   Sodium 127 (L) 135 - 145 mmol/L   Potassium 4.2 3.5 - 5.1 mmol/L   Chloride 92 (L) 98 - 111 mmol/L   CO2 25 22 - 32 mmol/L   Glucose, Bld 111 (H) 70 - 99 mg/dL   BUN 73 (H) 8 - 23 mg/dL   Creatinine, Ser 2.13 (H) 0.61 - 1.24 mg/dL   Calcium  8.4 (L) 8.9 - 10.3 mg/dL   GFR, Estimated 24 (L) >60 mL/min   Anion gap 10 5 - 15  CBC     Status: Abnormal   Collection Time: 05/31/23  5:50 AM  Result Value Ref Range   WBC 9.8 4.0 - 10.5 K/uL   RBC 2.47 (L) 4.22 - 5.81 MIL/uL   Hemoglobin 6.9 (L) 13.0 - 17.0 g/dL   HCT 08.6 (L) 57.8 - 46.9 %   MCV 84.2 80.0 - 100.0 fL   MCH 27.9 26.0 - 34.0 pg   MCHC 33.2 30.0 - 36.0 g/dL   RDW 62.9 (H) 52.8 - 41.3 %   Platelets 170 150 - 400 K/uL   nRBC 0.0 0.0 - 0.2 %  Hemoglobin A1c     Status: None   Collection Time: 05/31/23  5:50 AM  Result Value Ref Range   Hgb A1c MFr Bld 5.5 4.8 - 5.6 %   Mean Plasma Glucose 111.15 mg/dL  CK     Status: None   Collection Time: 05/31/23  5:50 AM  Result Value Ref Range   Total CK 88 49 - 397 U/L  Retic Panel     Status: Abnormal  Collection Time: 05/31/23  5:50 AM  Result Value Ref Range   Retic Ct Pct 2.7 0.4 - 3.1 %   RBC. 2.46 (L) 4.22 - 5.81 MIL/uL   Retic Count, Absolute 66.7 19.0 - 186.0 K/uL   Immature Retic Fract 22.3 (H) 2.3 - 15.9 %   Reticulocyte Hemoglobin 29.2 >27.9 pg  Iron and TIBC     Status: Abnormal   Collection Time: 05/31/23  7:33 AM  Result Value Ref Range   Iron 32 (L) 45 - 182 ug/dL   TIBC 161 096 - 045 ug/dL   Saturation Ratios 12 (L) 17.9 - 39.5 %   UIBC 228 ug/dL   Ferritin     Status: None   Collection Time: 05/31/23  7:33 AM  Result Value Ref Range   Ferritin 275 24 - 336 ng/mL  Glucose, capillary     Status: Abnormal   Collection Time: 05/31/23  7:52 AM  Result Value Ref Range   Glucose-Capillary 132 (H) 70 - 99 mg/dL  Prepare RBC (crossmatch)     Status: None   Collection Time: 05/31/23  8:00 AM  Result Value Ref Range   Order Confirmation      ORDER PROCESSED BY BLOOD BANK Performed at [facility], [address] [place], [place] [address]   Glucose, capillary     Status: Abnormal   Collection Time: 05/31/23 12:01 PM  Result Value Ref Range   Glucose-Capillary 253 (H) 70 - 99 mg/dL  Glucose, capillary     Status: Abnormal   Collection Time: 05/31/23  5:09 PM  Result Value Ref Range   Glucose-Capillary 191 (H) 70 - 99 mg/dL   Comment 1 Notify RN   Glucose, capillary     Status: Abnormal   Collection Time: 05/31/23  5:37 PM  Result Value Ref Range   Glucose-Capillary 215 (H) 70 - 99 mg/dL    Imaging: US  RENAL Result Date: 05/31/2023 CLINICAL DATA:  Decreased urine output.  Acute kidney injury. EXAM: RENAL / URINARY TRACT ULTRASOUND COMPLETE COMPARISON:  Abdominopelvic CT 05/30/2023. FINDINGS: Right Kidney: Renal measurements: 9.0 x 5.0 x 5.0 cm = volume: 130.5 mL. Echogenicity within normal limits. Mild cortical thinning and prominent renal sinus fat. No hydronephrosis or focal lesion. Left Kidney: Renal measurements: 9.9 x 5.2 x 4.8 cm = volume: 136.8 mL. Echogenicity within normal limits. Mild cortical thinning and prominent renal sinus fat. No hydronephrosis or focal lesion. Bladder: Appears normal for degree of bladder distention. Other: None. IMPRESSION: Mild renal cortical thinning bilaterally.  No hydronephrosis. Electronically Signed   By: Elmon Hagedorn M.D.   On: 05/31/2023 14:28    Assessment and Plan: This is a 63 y.o. male with a right gluteal/hip hematoma.  - Discussed with patient the findings on his CT  scan.  I am not able to see the images from [facility] but Dr. Macarthur Savory was able to look at the CT scan done there and overall the size is similar comparing the hematoma on 4/27 and 4/30.  CT scan from yesterday that showed this to be a little bit bigger but does not appear to be significantly bigger.  At this point, I think it would be appropriate to try conservative measures.  He could do ice packs to the right thigh and gluteal area to help decrease swelling and constrict blood vessels.  He could also have an Ace wrap around his gluteal hip areas to help with compression.  Continue checking hemoglobin levels.  If any continued  decrease, discussed with patient that we will try to get the vascular surgery team involved for possible evaluation for embolization.  If that is not feasible, then the last resort would be surgical exploration of this area.  Discussed with patient that unfortunately this does not usually lead to finding the bleeding site and instead will be giving him a large wound but this will be the last resort.  For now, he has been stable with a stable hemoglobin this afternoon and normal vital signs. - With regards to the blistering area in the right thigh, at this point there is no evidence of infection in this area.  I agree with dressing recommendations by the wound care team.  He should have this changed once daily and closely monitor to make sure is not getting infected.  At this point there is no specific need for antibiotics. - Will continue following with you.  I spent 45 minutes dedicated to the care of this patient on the date of this encounter to include pre-visit review of records, face-to-face time with the patient discussing diagnosis and management, and any post-visit coordination of care.   Marene Shape, MD [facility] Pg:  [phone]

## 2023-05-31 NOTE — Consult Note (Addendum)
 WOC Nurse Consult Note: patient with fall with significant hematoma to R hip/thigh; on Xarelto  Reason for Consult: R thigh blister/skin tear  Wound type: full thickness post trauma  Pressure Injury POA: NA, not related to pressure  Measurement: see nursing flowsheet  Wound bed: red moist  Drainage (amount, consistency, odor)  Periwound: significant ecchymosis  Dressing procedure/placement/frequency:  Cleanse R thigh wound with NS, apply silver hydrofiber (Aquacel AG [employer] #[id number]) to wound bed daily and secure with silicone foam.  SOAK SILVER IN SALINE FOR ATRAUMATIC REMOVAL IF STUCK TO WOUND BED.   POC discussed with bedside nurse. WOC team will not follow. Re-consult if further needs arise.   Thank you,    Ronni Colace MSN, RN-BC, [employer] [phone]

## 2023-06-01 DIAGNOSIS — N179 Acute kidney failure, unspecified: Secondary | ICD-10-CM | POA: Diagnosis not present

## 2023-06-01 DIAGNOSIS — W19XXXA Unspecified fall, initial encounter: Secondary | ICD-10-CM | POA: Diagnosis not present

## 2023-06-01 DIAGNOSIS — D62 Acute posthemorrhagic anemia: Secondary | ICD-10-CM | POA: Diagnosis not present

## 2023-06-01 DIAGNOSIS — N1832 Chronic kidney disease, stage 3b: Secondary | ICD-10-CM | POA: Diagnosis not present

## 2023-06-01 DIAGNOSIS — E871 Hypo-osmolality and hyponatremia: Secondary | ICD-10-CM | POA: Diagnosis not present

## 2023-06-01 DIAGNOSIS — S8011XA Contusion of right lower leg, initial encounter: Secondary | ICD-10-CM | POA: Diagnosis not present

## 2023-06-01 LAB — TYPE AND SCREEN
ABO/RH(D): A POS
Antibody Screen: NEGATIVE
Unit division: 0
Unit division: 0

## 2023-06-01 LAB — OSMOLALITY, URINE: Osmolality, Ur: 283 mosm/kg — ABNORMAL LOW (ref 300–900)

## 2023-06-01 LAB — BPAM RBC
Blood Product Expiration Date: 202506022359
Blood Product Expiration Date: 202506032359
ISSUE DATE / TIME: 202504301546
ISSUE DATE / TIME: 202505010845
Unit Type and Rh: 6200
Unit Type and Rh: 6200

## 2023-06-01 LAB — URINALYSIS, COMPLETE (UACMP) WITH MICROSCOPIC
Bacteria, UA: NONE SEEN
Bilirubin Urine: NEGATIVE
Glucose, UA: NEGATIVE mg/dL
Hgb urine dipstick: NEGATIVE
Ketones, ur: NEGATIVE mg/dL
Leukocytes,Ua: NEGATIVE
Nitrite: NEGATIVE
Protein, ur: NEGATIVE mg/dL
RBC / HPF: 0 RBC/hpf (ref 0–5)
Specific Gravity, Urine: 1.01 (ref 1.005–1.030)
Squamous Epithelial / HPF: 0 /HPF (ref 0–5)
pH: 7 (ref 5.0–8.0)

## 2023-06-01 LAB — CBC
HCT: 21 % — ABNORMAL LOW (ref 39.0–52.0)
Hemoglobin: 7 g/dL — ABNORMAL LOW (ref 13.0–17.0)
MCH: 26.7 pg (ref 26.0–34.0)
MCHC: 33.3 g/dL (ref 30.0–36.0)
MCV: 80.2 fL (ref 80.0–100.0)
Platelets: 174 10*3/uL (ref 150–400)
RBC: 2.62 MIL/uL — ABNORMAL LOW (ref 4.22–5.81)
RDW: 18.1 % — ABNORMAL HIGH (ref 11.5–15.5)
WBC: 9.2 10*3/uL (ref 4.0–10.5)
nRBC: 0 % (ref 0.0–0.2)

## 2023-06-01 LAB — GLUCOSE, CAPILLARY
Glucose-Capillary: 161 mg/dL — ABNORMAL HIGH (ref 70–99)
Glucose-Capillary: 170 mg/dL — ABNORMAL HIGH (ref 70–99)
Glucose-Capillary: 231 mg/dL — ABNORMAL HIGH (ref 70–99)
Glucose-Capillary: 241 mg/dL — ABNORMAL HIGH (ref 70–99)
Glucose-Capillary: 257 mg/dL — ABNORMAL HIGH (ref 70–99)

## 2023-06-01 LAB — COMPREHENSIVE METABOLIC PANEL WITH GFR
ALT: 18 U/L (ref 0–44)
AST: 15 U/L (ref 15–41)
Albumin: 2.9 g/dL — ABNORMAL LOW (ref 3.5–5.0)
Alkaline Phosphatase: 36 U/L — ABNORMAL LOW (ref 38–126)
Anion gap: 8 (ref 5–15)
BUN: 70 mg/dL — ABNORMAL HIGH (ref 8–23)
CO2: 24 mmol/L (ref 22–32)
Calcium: 7.8 mg/dL — ABNORMAL LOW (ref 8.9–10.3)
Chloride: 91 mmol/L — ABNORMAL LOW (ref 98–111)
Creatinine, Ser: 2.43 mg/dL — ABNORMAL HIGH (ref 0.61–1.24)
GFR, Estimated: 29 mL/min — ABNORMAL LOW (ref >60)
Glucose, Bld: 143 mg/dL — ABNORMAL HIGH (ref 70–99)
Potassium: 4.1 mmol/L (ref 3.5–5.1)
Sodium: 123 mmol/L — ABNORMAL LOW (ref 135–145)
Total Bilirubin: 1.4 mg/dL — ABNORMAL HIGH (ref 0.0–1.2)
Total Protein: 5.3 g/dL — ABNORMAL LOW (ref 6.5–8.1)

## 2023-06-01 LAB — PROTIME-INR
INR: 1.3 — ABNORMAL HIGH (ref 0.8–1.2)
Prothrombin Time: 16.6 s — ABNORMAL HIGH (ref 11.4–15.2)

## 2023-06-01 LAB — MAGNESIUM: Magnesium: 2.2 mg/dL (ref 1.7–2.4)

## 2023-06-01 LAB — SODIUM, URINE, RANDOM: Sodium, Ur: 25 mmol/L

## 2023-06-01 LAB — HEMOGLOBIN
Hemoglobin: 7.1 g/dL — ABNORMAL LOW (ref 13.0–17.0)
Hemoglobin: 7.4 g/dL — ABNORMAL LOW (ref 13.0–17.0)
Hemoglobin: 7.5 g/dL — ABNORMAL LOW (ref 13.0–17.0)

## 2023-06-01 LAB — HEMOGLOBIN AND HEMATOCRIT, BLOOD
HCT: 22.1 % — ABNORMAL LOW (ref 39.0–52.0)
Hemoglobin: 7.3 g/dL — ABNORMAL LOW (ref 13.0–17.0)

## 2023-06-01 MED ORDER — UREA 15 G PO PACK
15.0000 g | PACK | Freq: Two times a day (BID) | ORAL | Status: DC
  Administered 2023-06-01 – 2023-06-03 (×5): 15 g via ORAL
  Filled 2023-06-01 (×5): qty 1

## 2023-06-01 NOTE — Plan of Care (Signed)
   Problem: Education: Goal: Ability to describe self-care measures that may prevent or decrease complications (Diabetes Survival Skills Education) will improve Outcome: Progressing Goal: Individualized Educational Video(s) Outcome: Progressing   Problem: Coping: Goal: Ability to adjust to condition or change in health will improve Outcome: Progressing   Problem: Fluid Volume: Goal: Ability to maintain a balanced intake and output will improve Outcome: Progressing   Problem: Health Behavior/Discharge Planning: Goal: Ability to identify and utilize available resources and services will improve Outcome: Progressing Goal: Ability to manage health-related needs will improve Outcome: Progressing   Problem: Metabolic: Goal: Ability to maintain appropriate glucose levels will improve Outcome: Progressing   Problem: Nutritional: Goal: Maintenance of adequate nutrition will improve Outcome: Progressing Goal: Progress toward achieving an optimal weight will improve Outcome: Progressing   Problem: Skin Integrity: Goal: Risk for impaired skin integrity will decrease Outcome: Progressing   Problem: Tissue Perfusion: Goal: Adequacy of tissue perfusion will improve Outcome: Progressing   Problem: Education: Goal: Knowledge of General Education information will improve Description: Including pain rating scale, medication(s)/side effects and non-pharmacologic comfort measures Outcome: Progressing   Problem: Health Behavior/Discharge Planning: Goal: Ability to manage health-related needs will improve Outcome: Progressing   Problem: Clinical Measurements: Goal: Ability to maintain clinical measurements within normal limits will improve Outcome: Progressing Goal: Will remain free from infection Outcome: Progressing Goal: Diagnostic test results will improve Outcome: Progressing Goal: Respiratory complications will improve Outcome: Progressing Goal: Cardiovascular complication will  be avoided Outcome: Progressing   Problem: Activity: Goal: Risk for activity intolerance will decrease Outcome: Progressing   Problem: Nutrition: Goal: Adequate nutrition will be maintained Outcome: Progressing   Problem: Coping: Goal: Level of anxiety will decrease Outcome: Progressing   Problem: Elimination: Goal: Will not experience complications related to bowel motility Outcome: Progressing Goal: Will not experience complications related to urinary retention Outcome: Progressing   Problem: Pain Managment: Goal: General experience of comfort will improve and/or be controlled Outcome: Progressing   Problem: Safety: Goal: Ability to remain free from injury will improve Outcome: Progressing   Problem: Skin Integrity: Goal: Risk for impaired skin integrity will decrease Outcome: Progressing   Problem: Education: Goal: Ability to identify signs and symptoms of gastrointestinal bleeding will improve Outcome: Progressing   Problem: Bowel/Gastric: Goal: Will show no signs and symptoms of gastrointestinal bleeding Outcome: Progressing   Problem: Fluid Volume: Goal: Will show no signs and symptoms of excessive bleeding Outcome: Progressing   Problem: Clinical Measurements: Goal: Complications related to the disease process, condition or treatment will be avoided or minimized Outcome: Progressing

## 2023-06-01 NOTE — TOC Initial Note (Signed)
 Transition of Care [facility]) - Initial/Assessment Note    Patient Details  Name: Patrick Sandoval MRN: [id number] Date of Birth: October 04, 1960  Transition of [facility] CM/SW Contact:    Elsie Halo, RN Phone Number: 06/01/2023, 12:55 PM  Clinical Narrative:                  Patient is from home with his wife. No needs have been identified. Please reconsult TOC if discharge planning needs are present.         Patient Goals and CMS Choice            Expected Discharge Plan and Services                                              Prior Living Arrangements/Services                       Activities of Daily Living   ADL Screening (condition at time of admission) Independently performs ADLs?: Yes (appropriate for developmental age) Is the patient deaf or have difficulty hearing?: No Does the patient have difficulty seeing, even when wearing glasses/contacts?: No Does the patient have difficulty concentrating, remembering, or making decisions?: No  Permission Sought/Granted                  Emotional Assessment              Admission diagnosis:  Acute blood loss anemia [D62] AKI (acute kidney injury) (HCC) [N17.9] Hematoma of right lower extremity, initial encounter [S80.11XA] Patient Active Problem List   Diagnosis Date Noted   Hematoma of right lower extremity 05/31/2023   Acute blood loss anemia 05/30/2023   Hyponatremia 05/30/2023   AKI (acute kidney injury) (HCC) 05/30/2023   CKD stage 3b, GFR 30-44 ml/min (HCC) 05/30/2023   Atrial fibrillation, chronic (HCC) 05/30/2023   On anticoagulant therapy 05/30/2023   Insulin  dependent type 2 diabetes mellitus (HCC) 05/30/2023   Hematoma 05/30/2023   Hyperlipidemia 05/30/2023   Hypothyroidism 05/30/2023   Essential hypertension 05/30/2023   Morbid obesity (HCC) 05/30/2023   PCP:  Parker Bollard, MD Pharmacy:   [facility] [address] [place] - [address] [place] [address] Phone: [phone] Fax: [phone]     Social Drivers of Health (SDOH) Social History: SDOH Screenings   Food Insecurity: No Food Insecurity (05/30/2023)  Housing: Low Risk  (05/30/2023)  Transportation Needs: No Transportation Needs (05/30/2023)  Utilities: Not At Risk (05/30/2023)  Financial Resource Strain: Low Risk  (03/05/2023)   Received from [facility]  Physical Activity: Inactive (03/29/2018)   Received from [facility], [facility]  Social Connections: Unknown (05/30/2023)  Stress: No Stress Concern Present (03/29/2018)   Received from [facility], [facility]  Tobacco Use: Medium Risk (05/30/2023)  Health Literacy: Adequate Health Literacy (11/23/2021)   Received from [facility]   SDOH Interventions:     Readmission Risk Interventions     No data to display

## 2023-06-01 NOTE — Progress Notes (Signed)
 06/01/2023  Subjective: No acute events overnight.  Patient reports stable tenderness in the right thigh/gluteal area.  Hgb stable at 7.0 overnight and 7.3 this morning.    Vital signs: Temp:  [97.7 F (36.5 C)-98.7 F (37.1 C)] 98.3 F (36.8 C) (05/02 0750) Pulse Rate:  [85-102] 87 (05/02 0750) Resp:  [16-18] 18 (05/02 0750) BP: (101-135)/(41-69) 128/57 (05/02 0750) SpO2:  [100 %] 100 % (05/02 0750)   Intake/Output: 05/01 0701 - 05/02 0700 In: 372.5 [Blood:372.5] Out: -  Last BM Date : 05/31/23  Physical Exam: Constitutional: No acute distress Extremity:  Right thigh with stable hematoma and ecchymosis.  Lateral aspect with foam dressing at the area of blistering.  No erythema/induration to suggest infection.  Labs:  Recent Labs    05/31/23 0550 05/31/23 1802 05/31/23 2347 06/01/23 0730  WBC 9.8  --  9.2  --   HGB 6.9*   < > 7.0* 7.3*  HCT 20.8*  --  21.0* 22.1*  PLT 170  --  174  --    < > = values in this interval not displayed.   Recent Labs    05/31/23 0550 05/31/23 2347  NA 127* 123*  K 4.2 4.1  CL 92* 91*  CO2 25 24  GLUCOSE 111* 143*  BUN 73* 70*  CREATININE 2.89* 2.43*  CALCIUM  8.4* 7.8*   Recent Labs    05/31/23 2347  LABPROT 16.6*  INR 1.3*    Imaging: US  RENAL Result Date: 05/31/2023 CLINICAL DATA:  Decreased urine output.  Acute kidney injury. EXAM: RENAL / URINARY TRACT ULTRASOUND COMPLETE COMPARISON:  Abdominopelvic CT 05/30/2023. FINDINGS: Right Kidney: Renal measurements: 9.0 x 5.0 x 5.0 cm = volume: 130.5 mL. Echogenicity within normal limits. Mild cortical thinning and prominent renal sinus fat. No hydronephrosis or focal lesion. Left Kidney: Renal measurements: 9.9 x 5.2 x 4.8 cm = volume: 136.8 mL. Echogenicity within normal limits. Mild cortical thinning and prominent renal sinus fat. No hydronephrosis or focal lesion. Bladder: Appears normal for degree of bladder distention. Other: None. IMPRESSION: Mild renal cortical thinning  bilaterally.  No hydronephrosis. Electronically Signed   By: Elmon Hagedorn M.D.   On: 05/31/2023 14:28    Assessment/Plan: This is a 63 y.o. male with right thigh/gluteal hematoma  --Discussed with the patient that his Hgb is stable, indicating no further bleeding. His Hgb is at the lower end of the threshold for transfusion, and he may benefit from an additional unit pRBC. --Continue daily dressing changes to the right thigh area per [facility] RN instructions/order. --Would recommend continuing to hold the Xarelto for at least another 24 hrs. --No surgical intervention needed at this point.  Will sign off for now, but are available if any questions/concerns.   I spent 35 minutes dedicated to the care of this patient on the date of this encounter to include pre-visit review of records, face-to-face time with the patient discussing diagnosis and management, and any post-visit coordination of care.  Marene Shape, MD [facility]

## 2023-06-01 NOTE — Progress Notes (Signed)
 Progress Note   Patient: Patrick Sandoval ZOX:[id number] DOB: 1960-10-13 DOA: 05/30/2023     2 DOS: the patient was seen and examined on 06/01/2023   Brief hospital course:    63yo hx of L BKA, afib on xarelto, presented to [facility] 4/27 with fall and right hip hematoma.  CTA at the time showed now bleeding, was told to stop xarelto.  Shows up here with hypotension, hgb 6, and larger hematoma via CT.  Been off Xarelto since [facility].   Assessment and Plan:   Acute blood loss anemia - Hemoglobin at [facility] 4/27 was 10.7.  On presentation here, 6.1.  Improved to 6.9 after 1 unit PRBCs.  S/p 1 more unit yesterday (2 total).  Recheck hemoglobin stable at 7.0 x 2.  Just on the threshold of transfusion.  Will continue to trend hemoglobin every 6 hours.  Will consider transfusion if hemoglobin less than 7 or patient symptomatic.  Etiology likely from bleeding hematoma.  Holding Xarelto.  Iron studies showing iron deficiency.  Continues on p.o. iron supplementation as well.   Acute right thigh hematoma (POA) - Diagnosed at [facility] 4/27 with CT angio showed no bleeding, hematoma size 5.8 cm x 11 cm.  Repeat CT here shows possible increase in size to 15 x 12 x 6 cm.  Personally reviewed imaging from [facility] comparison to new imaging here showing hematoma mostly stable.  Unable to repeat CT angio given AKI.  General surgery consulted to evaluate if surgical intervention necessary.  At this time no need for surgical intervention, general surgery signed off.  Vascular consult placed as well.   Acute kidney injury on CKD 3B - Likely prerenal etiology given patient's anemia.  Creatinine on presentation 3.99 (previous baseline 1.9 at [facility] 4/27).  Continue showing slow improvement, creatinine 2.43 this morning.  Will recheck BMP and magnesium in AM.  Monitor urine output.   Hyponatremia - Likely from AKI, decreased p.o. intake.  Serum sodium had initially showed mild improvement after IV fluids and PRBC, from 123 on presentation to  127.  However this morning, back down to 123.  Will order urine osmole, urine sodium.  Will initiate urea  powder twice daily.  Will recheck BMP in AM.   Insulin -dependent diabetes mellitus - Insulin  sliding scale on board.   Atrial fibrillation - Continues on telemetry.  Holding Xarelto.  Continue Nebivolol .  Appears to be rate controlled at this time.   Struct of sleep apnea - Ordered CPAP for bedtime however patient declined.   Subjective: Patient resting comfortably, wife at bedside.  Denies any fever, chills, shortness of breath, chest pain, nausea, vomiting abdominal pain.  Right hip pain appears stable if not mildly improved.  Still having weeping.  Physical Exam: Vitals:   05/31/23 2026 06/01/23 0018 06/01/23 0416 06/01/23 0750  BP: (!) 135/55 (!) 101/59 (!) 111/58 (!) 128/57  Pulse: 87 90 (!) 102 87  Resp: 16 16 17 18   Temp: 98.7 F (37.1 C) 98.6 F (37 C) 98.5 F (36.9 C) 98.3 F (36.8 C)  TempSrc: Oral Oral Oral   SpO2: 100% 100% 100% 100%  Weight:      Height:        GENERAL:  Alert, pleasant, no acute distress  HEENT:  EOMI CARDIOVASCULAR:  RRR, no murmurs appreciated RESPIRATORY:  Clear to auscultation, no wheezing, rales, or rhonchi GASTROINTESTINAL:  Soft, nontender, nondistended EXTREMITIES: Edema and tenderness in right hip NEURO: No new focal deficit appreciated SKIN:  Scattered ecchymosis, frank ecchymosis in right  hip with weeping serosanguineous discharge PSYCH:  Appropriate mood and affect   Data Reviewed:  CT angio imaging data from [facility] 4/27 reviewed in comparison with repeat CT done here 5/1.  Shows mostly stable size and measurement of hematoma, no significant change in size/border.  Labs: CBC: Recent Labs  Lab 05/30/23 1314 05/31/23 0550 05/31/23 1802 05/31/23 2347 06/01/23 0730  WBC 12.3* 9.8  --  9.2  --   NEUTROABS 9.8*  --   --   --   --   HGB 6.1* 6.9* 7.0* 7.0* 7.3*  HCT 18.7* 20.8*  --  21.0* 22.1*  MCV 85.4 84.2  --  80.2   --   PLT 186 170  --  174  --    Basic Metabolic Panel: Recent Labs  Lab 05/30/23 1314 05/31/23 0550 05/31/23 2347  NA 123* 127* 123*  K 3.8 4.2 4.1  CL 88* 92* 91*  CO2 23 25 24   GLUCOSE 109* 111* 143*  BUN 79* 73* 70*  CREATININE 3.99* 2.89* 2.43*  CALCIUM  8.3* 8.4* 7.8*  MG  --   --  2.2   Liver Function Tests: Recent Labs  Lab 05/30/23 1314 05/31/23 2347  AST 20 15  ALT 19 18  ALKPHOS 28* 36*  BILITOT 0.9 1.4*  PROT 5.5* 5.3*  ALBUMIN 2.9* 2.9*   CBG: Recent Labs  Lab 05/31/23 1709 05/31/23 1737 05/31/23 2048 06/01/23 0747 06/01/23 1104  GLUCAP 191* 215* 189* 161* 241*    Scheduled Meds:  sodium chloride    Intravenous Once   anagrelide   0.5 mg Oral QID   atorvastatin   10 mg Oral QHS   ferrous sulfate   325 mg Oral Q breakfast   gabapentin   600 mg Oral TID   insulin  aspart  0-5 Units Subcutaneous QHS   insulin  aspart  0-9 Units Subcutaneous TID WC   liothyronine   5 mcg Oral Daily   nebivolol   20 mg Oral Daily   pantoprazole   40 mg Oral QHS   urea   15 g Oral BID   Continuous Infusions:  sodium chloride  Stopped (05/30/23 1401)   PRN Meds:.acetaminophen  **OR** acetaminophen , hydrALAZINE , metoprolol  tartrate, ondansetron  **OR** ondansetron  (ZOFRAN ) IV, mouth rinse, senna-docusate  Family Communication: Wife at bedside  Disposition: Status is: Inpatient Remains inpatient appropriate because: Acute blood loss anemia with hematoma     Time spent: 37 minutes  Author: Jodeane Mulligan, DO 06/01/2023 11:24 AM  For on call review [email or web address].

## 2023-06-02 DIAGNOSIS — N179 Acute kidney failure, unspecified: Secondary | ICD-10-CM | POA: Diagnosis not present

## 2023-06-02 DIAGNOSIS — E871 Hypo-osmolality and hyponatremia: Secondary | ICD-10-CM | POA: Diagnosis not present

## 2023-06-02 DIAGNOSIS — S8011XA Contusion of right lower leg, initial encounter: Secondary | ICD-10-CM | POA: Diagnosis not present

## 2023-06-02 DIAGNOSIS — D62 Acute posthemorrhagic anemia: Secondary | ICD-10-CM | POA: Diagnosis not present

## 2023-06-02 LAB — CBC
HCT: 22.1 % — ABNORMAL LOW (ref 39.0–52.0)
Hemoglobin: 7 g/dL — ABNORMAL LOW (ref 13.0–17.0)
MCH: 26.5 pg (ref 26.0–34.0)
MCHC: 31.7 g/dL (ref 30.0–36.0)
MCV: 83.7 fL (ref 80.0–100.0)
Platelets: 170 10*3/uL (ref 150–400)
RBC: 2.64 MIL/uL — ABNORMAL LOW (ref 4.22–5.81)
RDW: 18.2 % — ABNORMAL HIGH (ref 11.5–15.5)
WBC: 8.4 10*3/uL (ref 4.0–10.5)
nRBC: 0 % (ref 0.0–0.2)

## 2023-06-02 LAB — MAGNESIUM: Magnesium: 2.2 mg/dL (ref 1.7–2.4)

## 2023-06-02 LAB — HEMOGLOBIN
Hemoglobin: 7 g/dL — ABNORMAL LOW (ref 13.0–17.0)
Hemoglobin: 7.1 g/dL — ABNORMAL LOW (ref 13.0–17.0)
Hemoglobin: 7.3 g/dL — ABNORMAL LOW (ref 13.0–17.0)

## 2023-06-02 LAB — BASIC METABOLIC PANEL WITH GFR
Anion gap: 9 (ref 5–15)
BUN: 80 mg/dL — ABNORMAL HIGH (ref 8–23)
CO2: 25 mmol/L (ref 22–32)
Calcium: 8.4 mg/dL — ABNORMAL LOW (ref 8.9–10.3)
Chloride: 95 mmol/L — ABNORMAL LOW (ref 98–111)
Creatinine, Ser: 1.89 mg/dL — ABNORMAL HIGH (ref 0.61–1.24)
GFR, Estimated: 39 mL/min — ABNORMAL LOW (ref >60)
Glucose, Bld: 204 mg/dL — ABNORMAL HIGH (ref 70–99)
Potassium: 4.2 mmol/L (ref 3.5–5.1)
Sodium: 129 mmol/L — ABNORMAL LOW (ref 135–145)

## 2023-06-02 LAB — GLUCOSE, CAPILLARY
Glucose-Capillary: 237 mg/dL — ABNORMAL HIGH (ref 70–99)
Glucose-Capillary: 257 mg/dL — ABNORMAL HIGH (ref 70–99)
Glucose-Capillary: 195 mg/dL — ABNORMAL HIGH (ref 70–99)
Glucose-Capillary: 294 mg/dL — ABNORMAL HIGH (ref 70–99)

## 2023-06-02 NOTE — Plan of Care (Signed)
   Problem: Education: Goal: Ability to describe self-care measures that may prevent or decrease complications (Diabetes Survival Skills Education) will improve Outcome: Progressing Goal: Individualized Educational Video(s) Outcome: Progressing   Problem: Coping: Goal: Ability to adjust to condition or change in health will improve Outcome: Progressing   Problem: Fluid Volume: Goal: Ability to maintain a balanced intake and output will improve Outcome: Progressing   Problem: Health Behavior/Discharge Planning: Goal: Ability to identify and utilize available resources and services will improve Outcome: Progressing Goal: Ability to manage health-related needs will improve Outcome: Progressing   Problem: Metabolic: Goal: Ability to maintain appropriate glucose levels will improve Outcome: Progressing   Problem: Nutritional: Goal: Maintenance of adequate nutrition will improve Outcome: Progressing Goal: Progress toward achieving an optimal weight will improve Outcome: Progressing   Problem: Skin Integrity: Goal: Risk for impaired skin integrity will decrease Outcome: Progressing   Problem: Tissue Perfusion: Goal: Adequacy of tissue perfusion will improve Outcome: Progressing   Problem: Education: Goal: Knowledge of General Education information will improve Description: Including pain rating scale, medication(s)/side effects and non-pharmacologic comfort measures Outcome: Progressing   Problem: Health Behavior/Discharge Planning: Goal: Ability to manage health-related needs will improve Outcome: Progressing   Problem: Clinical Measurements: Goal: Ability to maintain clinical measurements within normal limits will improve Outcome: Progressing Goal: Will remain free from infection Outcome: Progressing Goal: Diagnostic test results will improve Outcome: Progressing Goal: Respiratory complications will improve Outcome: Progressing Goal: Cardiovascular complication will  be avoided Outcome: Progressing   Problem: Activity: Goal: Risk for activity intolerance will decrease Outcome: Progressing   Problem: Nutrition: Goal: Adequate nutrition will be maintained Outcome: Progressing   Problem: Coping: Goal: Level of anxiety will decrease Outcome: Progressing   Problem: Elimination: Goal: Will not experience complications related to bowel motility Outcome: Progressing Goal: Will not experience complications related to urinary retention Outcome: Progressing   Problem: Pain Managment: Goal: General experience of comfort will improve and/or be controlled Outcome: Progressing   Problem: Safety: Goal: Ability to remain free from injury will improve Outcome: Progressing   Problem: Skin Integrity: Goal: Risk for impaired skin integrity will decrease Outcome: Progressing   Problem: Education: Goal: Ability to identify signs and symptoms of gastrointestinal bleeding will improve Outcome: Progressing   Problem: Bowel/Gastric: Goal: Will show no signs and symptoms of gastrointestinal bleeding Outcome: Progressing   Problem: Fluid Volume: Goal: Will show no signs and symptoms of excessive bleeding Outcome: Progressing   Problem: Clinical Measurements: Goal: Complications related to the disease process, condition or treatment will be avoided or minimized Outcome: Progressing

## 2023-06-02 NOTE — Care Management Important Message (Signed)
 Important Message  Patient Details  Name: Patrick Sandoval MRN: [id number] Date of Birth: 1960/07/16   Important Message Given:  Yes - Medicare IM     Anise Kerns 06/02/2023, 1:00 PM

## 2023-06-02 NOTE — Progress Notes (Signed)
 Progress Note   Patient: Patrick Sandoval:[id number] DOB: 06/14/1960 DOA: 05/30/2023     3 DOS: the patient was seen and examined on 06/02/2023   Brief hospital course:  62yo hx of L BKA, afib on xarelto, presented to [facility] 4/27 with fall and right hip hematoma.  CTA at the time showed now bleeding, was told to stop xarelto.  Shows up here with hypotension, hgb 6, and larger hematoma via CT.  Been off Xarelto since [facility].   Assessment and Plan:   Acute blood loss anemia - Hemoglobin at [facility] 4/27 was 10.7.  On presentation here, 6.1.  S/p 2 units PRBCs.  Recheck hemoglobin stable around 7.  Just on the threshold of transfusion.  Will continue to trend hemoglobin every 6 hours.  Will consider transfusion if hemoglobin less than 7 or patient symptomatic.  Etiology likely from bleeding hematoma.  Holding Xarelto.  Iron studies showing iron deficiency.  Continues on p.o. iron supplementation as well.  Ideally will discharge home if hemoglobin is stable or trending upward.  Anticipate discharge later today or tomorrow.   Acute right thigh hematoma (POA) - Diagnosed at [facility] 4/27 with CT angio showed no bleeding, hematoma size 5.8 cm x 11 cm.  Repeat CT here shows possible increase in size to 15 x 12 x 6 cm.  Personally reviewed imaging from [facility] comparison to new imaging here showing hematoma mostly stable.  Unable to repeat CT angio given AKI.  General surgery consulted to evaluate if surgical intervention necessary.  At this time no need for surgical intervention, general surgery signed off.  Vascular consult placed as well.   Acute kidney injury on CKD 3B - Likely prerenal etiology given patient's anemia.  Creatinine on presentation 3.99 (previous baseline 1.9 at [facility] 4/27).  Continue showing slow improvement, creatinine 1.89 this morning.  Will recheck BMP and magnesium in AM.  Monitor urine output.   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 intake.  Serum sodium had initially  showed mild improvement after IV fluids and PRBC, from 123 on presentation to 127, then back down to 123.  Urine osmole inappropriately low suggesting possible SIADH.  Responding well to urea  powder twice daily.  Sodium up to 129 this morning.  Will recheck BMP in AM.  Insulin -dependent diabetes mellitus - Insulin  sliding scale on board.   Atrial fibrillation - Continues on telemetry.  Holding Xarelto.  Continue Nebivolol .  Appears to be rate controlled at this time.   Struct of sleep apnea - Ordered CPAP for bedtime however patient declined.   Subjective: Patient feeling well this morning.  Sitting up in bed, states he feels improved.  No shortness of breath, chest pain, nausea, vomiting, abdominal pain.  Was able to ambulate around the halls.  Pain is improved.  Eager for discharge home if hemoglobin stable.  Physical Exam: Vitals:   06/01/23 2337 06/02/23 0432 06/02/23 0749 06/02/23 1142  BP: 136/84 134/64 106/80 107/60  Pulse: 87 90 74 88  Resp: 16 18 18 18   Temp: 98.6 F (37 C) (!) 97 F (36.1 C) 98 F (36.7 C) 98 F (36.7 C)  TempSrc: Oral  Oral Oral  SpO2: 100% 99% 98% 100%  Weight:      Height:        GENERAL:  Alert, pleasant, no acute distress  HEENT:  EOMI CARDIOVASCULAR:  RRR, no murmurs appreciated RESPIRATORY:  Clear to auscultation, no wheezing, rales, or rhonchi GASTROINTESTINAL:  Soft, nontender, nondistended EXTREMITIES: Edema and tenderness  in right hip NEURO: No new focal deficit appreciated SKIN:  Scattered ecchymosis, frank ecchymosis in right hip with weeping serosanguineous discharge PSYCH:  Appropriate mood and affect   Data Reviewed:  No new imaging to review  Labs: CBC: Recent Labs  Lab 05/30/23 1314 05/31/23 0550 05/31/23 1802 05/31/23 2347 06/01/23 0730 06/01/23 1249 06/01/23 1927 06/01/23 2330 06/02/23 0508 06/02/23 1204  WBC 12.3* 9.8  --  9.2  --   --   --   --  8.4  --   NEUTROABS 9.8*  --   --   --   --   --   --   --   --    --   HGB 6.1* 6.9*   < > 7.0* 7.3* 7.5* 7.4* 7.1* 7.0* 7.0*  HCT 18.7* 20.8*  --  21.0* 22.1*  --   --   --  22.1*  --   MCV 85.4 84.2  --  80.2  --   --   --   --  83.7  --   PLT 186 170  --  174  --   --   --   --  170  --    < > = values in this interval not displayed.   Basic Metabolic Panel: Recent Labs  Lab 05/30/23 1314 05/31/23 0550 05/31/23 2347 06/02/23 0508  NA 123* 127* 123* 129*  K 3.8 4.2 4.1 4.2  CL 88* 92* 91* 95*  CO2 23 25 24 25   GLUCOSE 109* 111* 143* 204*  BUN 79* 73* 70* 80*  CREATININE 3.99* 2.89* 2.43* 1.89*  CALCIUM  8.3* 8.4* 7.8* 8.4*  MG  --   --  2.2 2.2   Liver Function Tests: Recent Labs  Lab 05/30/23 1314 05/31/23 2347  AST 20 15  ALT 19 18  ALKPHOS 28* 36*  BILITOT 0.9 1.4*  PROT 5.5* 5.3*  ALBUMIN 2.9* 2.9*   CBG: Recent Labs  Lab 06/01/23 1743 06/01/23 2030 06/01/23 2346 06/02/23 0814 06/02/23 1141  GLUCAP 170* 257* 231* 237* 257*    Scheduled Meds:  sodium chloride    Intravenous Once   anagrelide   0.5 mg Oral QID   atorvastatin   10 mg Oral QHS   ferrous sulfate   325 mg Oral Q breakfast   gabapentin   600 mg Oral TID   insulin  aspart  0-5 Units Subcutaneous QHS   insulin  aspart  0-9 Units Subcutaneous TID WC   liothyronine   5 mcg Oral Daily   nebivolol   20 mg Oral Daily   pantoprazole   40 mg Oral QHS   urea   15 g Oral BID   Continuous Infusions:  sodium chloride  Stopped (05/30/23 1401)   PRN Meds:.acetaminophen  **OR** acetaminophen , hydrALAZINE , metoprolol  tartrate, ondansetron  **OR** ondansetron  (ZOFRAN ) IV, mouth rinse, senna-docusate  Family Communication: Wife at bedside  Disposition: Status is: Inpatient Remains inpatient appropriate because: Anemia with hematoma     Time spent: 35 minutes  Author: Jodeane Mulligan, DO 06/02/2023 2:22 PM  For on call review [email or web address].

## 2023-06-03 DIAGNOSIS — D62 Acute posthemorrhagic anemia: Secondary | ICD-10-CM | POA: Diagnosis not present

## 2023-06-03 DIAGNOSIS — E871 Hypo-osmolality and hyponatremia: Secondary | ICD-10-CM | POA: Diagnosis not present

## 2023-06-03 DIAGNOSIS — N179 Acute kidney failure, unspecified: Secondary | ICD-10-CM | POA: Diagnosis not present

## 2023-06-03 DIAGNOSIS — S8011XA Contusion of right lower leg, initial encounter: Secondary | ICD-10-CM | POA: Diagnosis not present

## 2023-06-03 LAB — BASIC METABOLIC PANEL WITH GFR
Anion gap: 8 (ref 5–15)
BUN: 67 mg/dL — ABNORMAL HIGH (ref 8–23)
CO2: 26 mmol/L (ref 22–32)
Calcium: 8.6 mg/dL — ABNORMAL LOW (ref 8.9–10.3)
Chloride: 96 mmol/L — ABNORMAL LOW (ref 98–111)
Creatinine, Ser: 1.75 mg/dL — ABNORMAL HIGH (ref 0.61–1.24)
GFR, Estimated: 43 mL/min — ABNORMAL LOW (ref >60)
Glucose, Bld: 235 mg/dL — ABNORMAL HIGH (ref 70–99)
Potassium: 4.7 mmol/L (ref 3.5–5.1)
Sodium: 130 mmol/L — ABNORMAL LOW (ref 135–145)

## 2023-06-03 LAB — CBC
HCT: 22.4 % — ABNORMAL LOW (ref 39.0–52.0)
Hemoglobin: 7 g/dL — ABNORMAL LOW (ref 13.0–17.0)
MCH: 26.3 pg (ref 26.0–34.0)
MCHC: 31.3 g/dL (ref 30.0–36.0)
MCV: 84.2 fL (ref 80.0–100.0)
Platelets: 199 10*3/uL (ref 150–400)
RBC: 2.66 MIL/uL — ABNORMAL LOW (ref 4.22–5.81)
RDW: 18.1 % — ABNORMAL HIGH (ref 11.5–15.5)
WBC: 8.9 10*3/uL (ref 4.0–10.5)
nRBC: 0 % (ref 0.0–0.2)

## 2023-06-03 LAB — GLUCOSE, CAPILLARY: Glucose-Capillary: 247 mg/dL — ABNORMAL HIGH (ref 70–99)

## 2023-06-03 LAB — MAGNESIUM: Magnesium: 2.2 mg/dL (ref 1.7–2.4)

## 2023-06-03 MED ORDER — FERROUS SULFATE 325 (65 FE) MG PO TABS: 325.0000 mg | ORAL_TABLET | ORAL | 3 refills | Status: DC

## 2023-06-03 MED ORDER — NEBIVOLOL HCL 20 MG PO TABS: 20.0000 mg | ORAL_TABLET | Freq: Every day | ORAL | Status: AC

## 2023-06-03 NOTE — TOC Transition Note (Signed)
 Transition of Care [facility]) - Discharge Note   Patient Details  Name: Patrick Sandoval MRN: [id number] Date of Birth: 1960-09-11  [facility] of [facility]) CM/SW Contact:  Alexandra Ice, RN Phone Number: 06/03/2023, 11:12 AM   Clinical Narrative:     Patient to discharge today, home no needs. No TOC needs identified  Final next level of care: Home/Self Care Barriers to Discharge: Barriers Resolved   Patient Goals and CMS Choice Patient states their goals for this hospitalization and ongoing recovery are:: go home          Discharge Placement                  Name of family member notified: Franklyn Barsh Patient and family notified of of transfer: 06/03/23  Discharge Plan and Services Additional resources added to the After Visit Summary for                    DME Agency: NA       HH Arranged: NA          Social Drivers of Health (SDOH) Interventions SDOH Screenings   Food Insecurity: No Food Insecurity (05/30/2023)  Housing: Low Risk  (05/30/2023)  Transportation Needs: No Transportation Needs (05/30/2023)  Utilities: Not At Risk (05/30/2023)  Financial Resource Strain: Low Risk  (03/05/2023)   Received from [facility]  Physical Activity: Inactive (03/29/2018)   Received from [facility], [facility]  Social Connections: Unknown (05/30/2023)  Stress: No Stress Concern Present (03/29/2018)   Received from [facility], [facility]  Tobacco Use: Medium Risk (05/30/2023)  Health Literacy: Adequate Health Literacy (11/23/2021)   Received from [facility]     Readmission Risk Interventions     No data to display

## 2023-06-03 NOTE — Plan of Care (Signed)
   Problem: Education: Goal: Ability to describe self-care measures that may prevent or decrease complications (Diabetes Survival Skills Education) will improve Outcome: Progressing Goal: Individualized Educational Video(s) Outcome: Progressing   Problem: Coping: Goal: Ability to adjust to condition or change in health will improve Outcome: Progressing   Problem: Fluid Volume: Goal: Ability to maintain a balanced intake and output will improve Outcome: Progressing   Problem: Health Behavior/Discharge Planning: Goal: Ability to identify and utilize available resources and services will improve Outcome: Progressing Goal: Ability to manage health-related needs will improve Outcome: Progressing   Problem: Metabolic: Goal: Ability to maintain appropriate glucose levels will improve Outcome: Progressing   Problem: Nutritional: Goal: Maintenance of adequate nutrition will improve Outcome: Progressing Goal: Progress toward achieving an optimal weight will improve Outcome: Progressing   Problem: Skin Integrity: Goal: Risk for impaired skin integrity will decrease Outcome: Progressing   Problem: Tissue Perfusion: Goal: Adequacy of tissue perfusion will improve Outcome: Progressing   Problem: Education: Goal: Knowledge of General Education information will improve Description: Including pain rating scale, medication(s)/side effects and non-pharmacologic comfort measures Outcome: Progressing   Problem: Health Behavior/Discharge Planning: Goal: Ability to manage health-related needs will improve Outcome: Progressing   Problem: Clinical Measurements: Goal: Ability to maintain clinical measurements within normal limits will improve Outcome: Progressing Goal: Will remain free from infection Outcome: Progressing Goal: Diagnostic test results will improve Outcome: Progressing Goal: Respiratory complications will improve Outcome: Progressing Goal: Cardiovascular complication will  be avoided Outcome: Progressing   Problem: Activity: Goal: Risk for activity intolerance will decrease Outcome: Progressing   Problem: Nutrition: Goal: Adequate nutrition will be maintained Outcome: Progressing   Problem: Coping: Goal: Level of anxiety will decrease Outcome: Progressing   Problem: Elimination: Goal: Will not experience complications related to bowel motility Outcome: Progressing Goal: Will not experience complications related to urinary retention Outcome: Progressing   Problem: Pain Managment: Goal: General experience of comfort will improve and/or be controlled Outcome: Progressing   Problem: Safety: Goal: Ability to remain free from injury will improve Outcome: Progressing   Problem: Skin Integrity: Goal: Risk for impaired skin integrity will decrease Outcome: Progressing   Problem: Education: Goal: Ability to identify signs and symptoms of gastrointestinal bleeding will improve Outcome: Progressing   Problem: Bowel/Gastric: Goal: Will show no signs and symptoms of gastrointestinal bleeding Outcome: Progressing   Problem: Fluid Volume: Goal: Will show no signs and symptoms of excessive bleeding Outcome: Progressing   Problem: Clinical Measurements: Goal: Complications related to the disease process, condition or treatment will be avoided or minimized Outcome: Progressing

## 2023-06-03 NOTE — Discharge Summary (Signed)
 Physician Discharge Summary   Patient: Patrick Sandoval MRN: [id number] DOB: 1960-07-13  Admit date:     05/30/2023  Discharge date: 06/03/23  Discharge Physician: Jodeane Mulligan   PCP: Parker Bollard, MD   Recommendations at discharge:    Pt to be discharged home.   If you experience worsening fever, chills, chest pain, shortness of breath, or other concerning symptoms, please call your PCP or go to the emergency department immediately.  Discharge Diagnoses: Principal Problem:   Acute blood loss anemia Active Problems:   Hyponatremia   AKI (acute kidney injury) (HCC)   CKD stage 3b, GFR 30-44 ml/min (HCC)   Atrial fibrillation, chronic (HCC)   On anticoagulant therapy   Insulin  dependent type 2 diabetes mellitus (HCC)   Hematoma   Hyperlipidemia   Hypothyroidism   Essential hypertension   Morbid obesity (HCC)   Hematoma of right lower extremity  Resolved Problems:   * No resolved hospital problems. [facility] Hospital Course:  62yo hx of L BKA, afib on xarelto, presented to [facility] 4/27 with fall and right hip hematoma.  CTA at the time showed now bleeding, was told to stop xarelto.  Shows up here with hypotension, hgb 6, and larger hematoma via CT.  Been off Xarelto since [facility].   Assessment and Plan:   Acute blood loss anemia - Hemoglobin at [facility] 4/27 was 10.7.  On presentation here, 6.1.  S/p 2 units PRBCs.  Recheck hemoglobin stable around 7 x 2 days.  Held Xarelto during hospitalization.  Iron studies showing iron deficiency.  Continues on p.o. iron supplementation as well.  Will discharge patient home.  Patient can restart Xarelto tomorrow 5/5.  Will skip prescription for p.o. iron supplementation to take as directed.    Acute right thigh hematoma (POA) - Diagnosed at [facility] 4/27 with CT angio showed no bleeding, hematoma size 5.8 cm x 11 cm.  Repeat CT here shows possible increase in size to 15 x 12 x 6 cm.  Personally reviewed imaging from [facility] comparison to new imaging here  showing hematoma mostly stable.  Unable to repeat CT angio given AKI.  General surgery consulted to evaluate if surgical intervention necessary.  At this time no need for surgical intervention, general surgery signed off.  No worsening bleeding.  Appears stable at this time.  Continue compression, ice.   Acute kidney injury on CKD 3B - Likely prerenal etiology given patient's anemia.  Creatinine on presentation 3.99 (previous baseline 1.9 at [facility] 4/27).  Continue showing slow improvement, creatinine 1.75 this morning.    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 intake.  Serum sodium had initially showed mild improvement after IV fluids and PRBC, from 123 on presentation to 127, then back down to 123.  Urine osmole inappropriately low suggesting possible SIADH.  Responding well to urea  powder twice daily.  Sodium up to 130 this morning.  No need to continue upon discharge.   Insulin -dependent diabetes mellitus - Resume home regiment.   Atrial fibrillation - Continue Nebivolol .  Xarelto as above.  Struct of sleep apnea - Ordered CPAP for bedtime however patient declined.   Consultants: General Surgery Procedures performed: None Disposition: Home Diet recommendation:  Discharge Diet Orders (From admission, onward)     Start     Ordered   06/03/23 0000  Diet - low sodium heart healthy        06/03/23 1059           Cardiac and Carb  modified diet  DISCHARGE MEDICATION: Allergies as of 06/03/2023       Reactions   Clindamycin Diarrhea   Morphine  Anaphylaxis, Other (See Comments), Shortness Of Breath   Amoxicillin  Dermatitis   Hydrocodone -acetaminophen  Nausea Only   Other Reaction(s): Dizziness hypoglycemia Nausea, vomiting, dizziness, hypoglycemia  Tolerates tylenol    Codeine    Empagliflozin Dermatitis   Meperidine Other (See Comments)   Mentioned in Pre-check preoperatively   Morphine  And Codeine    Prednisone  Nausea Only, Nausea And Vomiting         Medication List     STOP taking these medications    amoxicillin  500 MG capsule Commonly known as: AMOXIL        TAKE these medications    anagrelide  0.5 MG capsule Commonly known as: AGRYLIN Take 1 mg by mouth 2 (two) times daily.   atorvastatin  10 MG tablet Commonly known as: LIPITOR Take 10 mg by mouth daily.   ferrous sulfate  325 (65 FE) MG tablet Take 1 tablet (325 mg total) by mouth every other day.   furosemide 20 MG tablet Commonly known as: LASIX Take 20 mg by mouth.   gabapentin  600 MG tablet Commonly known as: NEURONTIN  Take 600 mg by mouth 3 (three) times daily.   insulin  aspart 100 UNIT/ML injection Commonly known as: novoLOG  Inject 15 Units into the skin 3 (three) times daily before meals.   insulin  glargine (2 Unit Dial) 300 UNIT/ML Solostar Pen Commonly known as: TOUJEO MAX Inject 40 Units into the skin daily.   Isturisa 1 MG Tabs Generic drug: Osilodrostat Phosphate Take 2 mg by mouth 2 (two) times daily.   levothyroxine 137 MCG tablet Commonly known as: SYNTHROID Take 137 mcg by mouth every morning.   liothyronine  5 MCG tablet Commonly known as: CYTOMEL  Take 5 mcg by mouth daily.   metFORMIN 500 MG 24 hr tablet Commonly known as: GLUCOPHAGE-XR Take 500 mg by mouth daily with supper.   Nebivolol  HCl 20 MG Tabs Take 1 tablet (20 mg total) by mouth daily. What changed: how much to take   olmesartan-hydrochlorothiazide 40-12.5 MG tablet Commonly known as: BENICAR HCT Take 1 tablet by mouth daily.   omeprazole 20 MG capsule Commonly known as: PRILOSEC Take 20 mg by mouth daily.   rivaroxaban 20 MG Tabs tablet Commonly known as: XARELTO Take 20 mg by mouth daily with supper.   Semaglutide (2 MG/DOSE) 8 MG/3ML Sopn Inject 2 mg into the skin once a week.               Discharge Care Instructions  (From admission, onward)           Start     Ordered   06/03/23 0000  Discharge wound care:       Comments: Cleanse  R thigh wound with NS, apply silver hydrofiber (Aquacel AG Timm Foot [id number])  to wound bed daily, cover with dry gauze and secure with silicone foam.  SOAK SILVER IN SALINE FOR ATRAUMATIC REMOVAL IF STUCK TO WOUND BED.  Change daily   06/03/23 1059            Follow-up Information     Huin, Jyl Or, MD Follow up.   Specialty: Family Medicine Why: Hospital follow up Contact information: [address], [address] [place] [address] [phone]                 Discharge Exam: Cleavon Curls Weights   05/30/23 1309  Weight: 119 kg  GENERAL:  Alert, pleasant, no acute distress  HEENT:  EOMI CARDIOVASCULAR:  RRR, no murmurs appreciated RESPIRATORY:  Clear to auscultation, no wheezing, rales, or rhonchi GASTROINTESTINAL:  Soft, nontender, nondistended EXTREMITIES: Edema and tenderness in right hip NEURO: No new focal deficit appreciated SKIN:  Scattered ecchymosis, frank ecchymosis in right hip with weeping serosanguineous discharge PSYCH:  Appropriate mood and affect   Condition at discharge: improving  The results of significant diagnostics from this hospitalization (including imaging, microbiology, ancillary and laboratory) are listed below for reference.   Imaging Studies: US  RENAL Result Date: 05/31/2023 CLINICAL DATA:  Decreased urine output.  Acute kidney injury. EXAM: RENAL / URINARY TRACT ULTRASOUND COMPLETE COMPARISON:  Abdominopelvic CT 05/30/2023. FINDINGS: Right Kidney: Renal measurements: 9.0 x 5.0 x 5.0 cm = volume: 130.5 mL. Echogenicity within normal limits. Mild cortical thinning and prominent renal sinus fat. No hydronephrosis or focal lesion. Left Kidney: Renal measurements: 9.9 x 5.2 x 4.8 cm = volume: 136.8 mL. Echogenicity within normal limits. Mild cortical thinning and prominent renal sinus fat. No hydronephrosis or focal lesion. Bladder: Appears normal for degree of bladder distention. Other: None. IMPRESSION: Mild renal cortical  thinning bilaterally.  No hydronephrosis. Electronically Signed   By: Elmon Hagedorn M.D.   On: 05/31/2023 14:28   CT ABDOMEN PELVIS WO CONTRAST Result Date: 05/30/2023 CLINICAL DATA:  Right gluteal hematoma. EXAM: CT ABDOMEN AND PELVIS WITHOUT CONTRAST TECHNIQUE: Multidetector CT imaging of the abdomen and pelvis was performed following the standard protocol without IV contrast. RADIATION DOSE REDUCTION: This exam was performed according to the departmental dose-optimization program which includes automated exposure control, adjustment of the mA and/or kV according to patient size and/or use of iterative reconstruction technique. COMPARISON:  None Available. FINDINGS: Lower chest: No acute abnormality. Hepatobiliary: Cholelithiasis. No biliary dilatation. Liver is unremarkable on these unenhanced images. Pancreas: Unremarkable. No pancreatic ductal dilatation or surrounding inflammatory changes. Spleen: Normal in size without focal abnormality. Adrenals/Urinary Tract: Adrenal glands are unremarkable. Kidneys are normal, without renal calculi, focal lesion, or hydronephrosis. Bladder is unremarkable. Stomach/Bowel: Stomach is within normal limits. Appendix appears normal. No evidence of bowel wall thickening, distention, or inflammatory changes. Vascular/Lymphatic: Aortic atherosclerosis. No enlarged abdominal or pelvic lymph nodes. Reproductive: Prostate is unremarkable. Other: Large subcutaneous hematoma is noted in right lateral gluteal and hip region, measuring 15 x 12 x 6 cm. No ascites or hernia is noted. Musculoskeletal: No acute or significant osseous findings. IMPRESSION: 15 x 12 x 6 cm hematoma is noted in subcutaneous tissues in the right lateral gluteal and hip region. Cholelithiasis. Aortic Atherosclerosis (ICD10-I70.0). Electronically Signed   By: Rosalene Colon M.D.   On: 05/30/2023 15:35    Microbiology: No results found for this or any previous visit.  Labs: CBC: Recent Labs  Lab  05/30/23 1314 05/31/23 0550 05/31/23 1802 05/31/23 2347 06/01/23 0730 06/01/23 1249 06/02/23 0508 06/02/23 1204 06/02/23 1800 06/02/23 2349 06/03/23 0555  WBC 12.3* 9.8  --  9.2  --   --  8.4  --   --   --  8.9  NEUTROABS 9.8*  --   --   --   --   --   --   --   --   --   --   HGB 6.1* 6.9*   < > 7.0* 7.3*   < > 7.0* 7.0* 7.3* 7.1* 7.0*  HCT 18.7* 20.8*  --  21.0* 22.1*  --  22.1*  --   --   --  22.4*  MCV 85.4 84.2  --  80.2  --   --  83.7  --   --   --  84.2  PLT 186 170  --  174  --   --  170  --   --   --  199   < > = values in this interval not displayed.   Basic Metabolic Panel: Recent Labs  Lab 05/30/23 1314 05/31/23 0550 05/31/23 2347 06/02/23 0508 06/03/23 0555  NA 123* 127* 123* 129* 130*  K 3.8 4.2 4.1 4.2 4.7  CL 88* 92* 91* 95* 96*  CO2 23 25 24 25 26   GLUCOSE 109* 111* 143* 204* 235*  BUN 79* 73* 70* 80* 67*  CREATININE 3.99* 2.89* 2.43* 1.89* 1.75*  CALCIUM  8.3* 8.4* 7.8* 8.4* 8.6*  MG  --   --  2.2 2.2 2.2   Liver Function Tests: Recent Labs  Lab 05/30/23 1314 05/31/23 2347  AST 20 15  ALT 19 18  ALKPHOS 28* 36*  BILITOT 0.9 1.4*  PROT 5.5* 5.3*  ALBUMIN 2.9* 2.9*   CBG: Recent Labs  Lab 06/02/23 0814 06/02/23 1141 06/02/23 1634 06/02/23 2007 06/03/23 0733  GLUCAP 237* 257* 195* 294* 247*    Discharge time spent: 29 minutes.  Signed: Jodeane Mulligan, DO [facility] 06/03/2023

## 2023-10-31 DIAGNOSIS — Z905 Acquired absence of kidney: Secondary | ICD-10-CM

## 2023-10-31 HISTORY — DX: Acquired absence of kidney: Z90.5

## 2024-01-21 ENCOUNTER — Ambulatory Visit: Admission: EM | Admit: 2024-01-21 | Discharge: 2024-01-21 | Disposition: A | Discharge status: Home / Self Care

## 2024-01-21 ENCOUNTER — Encounter: Payer: Self-pay | Admitting: *Deleted

## 2024-01-21 DIAGNOSIS — S0340XA Sprain of jaw, unspecified side, initial encounter: Secondary | ICD-10-CM | POA: Diagnosis not present

## 2024-01-21 NOTE — ED Triage Notes (Addendum)
 Patient states he yawned big and hard a few days ago and has pain from ear down to mouth.  Taking Tylenol  with little relief.  Has had cough/nasal congestion that has gotten better and had been going on over a week

## 2024-01-21 NOTE — ED Provider Notes (Signed)
 " [facility]    CSN: [id number] Arrival date & time: 01/21/24  1627      History   Chief Complaint Chief Complaint  Patient presents with   Jaw Pain    HPI Patrick Sandoval is a 63 y.o. male.   HPI  63 year old male with past medical history difficult for atrial fibrillation, diabetes, hypertension, pituitary tumor, CVA, thyroid disease, arthritis, CKD stage IIIb, status post nephrectomy presents for evaluation of right-sided jaw pain radiating into his ear.  He reports that he yawned several times hard a few days ago and has had pain ever since.  Pain increases with mastication.  No change in hearing or drainage from the ear.  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   Stroke [facility])    Thyroid disease     Patient Active Problem List   Diagnosis Date Noted   Hematoma of right lower extremity 05/31/2023   Acute blood loss anemia 05/30/2023   Hyponatremia 05/30/2023   AKI (acute kidney injury) 05/30/2023   CKD stage 3b, GFR 30-44 ml/min (HCC) 05/30/2023   Atrial fibrillation, chronic (HCC) 05/30/2023   On anticoagulant therapy 05/30/2023   Insulin  dependent type 2 diabetes mellitus (HCC) 05/30/2023   Hematoma 05/30/2023   Hyperlipidemia 05/30/2023   Hypothyroidism 05/30/2023   Essential hypertension 05/30/2023   Morbid obesity (HCC) 05/30/2023    Past Surgical History:  Procedure Laterality Date   bilteral rotator      EYE SURGERY Bilateral    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 Authorizing Provider  torsemide (DEMADEX) 20 MG tablet Take 40 mg by mouth. 01/11/24  Yes  [provider]  traZODone (DESYREL) 50 MG tablet Take 25 mg by mouth. 12/07/23 12/06/24 Yes [provider]  anagrelide  (AGRYLIN) 0.5 MG capsule Take 1 mg by mouth 2 (two) times daily.    [provider]  atorvastatin  (LIPITOR) 10 MG tablet Take 10 mg by mouth daily.    [provider]  gabapentin  (NEURONTIN ) 600 MG tablet Take 600 mg by mouth 3 (three) times daily. 04/18/23   [provider]  insulin  aspart (NOVOLOG ) 100 UNIT/ML injection Inject 15 Units into the skin 3 (three) times daily before meals.    [provider]  insulin  glargine, 2 Unit Dial, (TOUJEO MAX) 300 UNIT/ML Solostar Pen Inject 40 Units into the skin daily. 05/10/23   [provider]  levothyroxine (SYNTHROID) 137 MCG tablet Take 137 mcg by mouth every morning. 04/26/20   [provider]  liothyronine  (CYTOMEL ) 5 MCG tablet Take 5 mcg by mouth daily.    [provider]  Nebivolol  HCl 20 MG TABS Take 1 tablet (20 mg total) by mouth daily. 06/03/23   Arlon Carliss ORN, DO  olmesartan-hydrochlorothiazide (BENICAR HCT) 40-12.5 MG tablet Take 1 tablet by mouth daily.    [provider]  omeprazole (PRILOSEC) 20 MG capsule Take 20 mg by mouth daily.    [provider]  Osilodrostat Phosphate (ISTURISA) 1 MG TABS Take 2 mg by mouth  2 (two) times daily. 05/30/23   [provider]  rivaroxaban (XARELTO) 20 MG TABS tablet Take 20 mg by mouth daily with supper.    [provider]  Semaglutide, 2 MG/DOSE, 8 MG/3ML SOPN Inject 2 mg into the skin once a week. 08/14/22   [provider]  tamsulosin  (FLOMAX ) 0.4 MG CAPS capsule Take 0.4 mg by mouth daily.    [provider]  zolpidem (AMBIEN) 10 MG tablet Take 10 mg by mouth at bedtime as needed for sleep.  05/13/20  [provider]    Family History Family History  Problem Relation Age of Onset   Heart failure Mother    Stroke Father    Cancer Father      Social History Social History[1]   Allergies   Clindamycin, Morphine , Amoxicillin , Hydrocodone -acetaminophen , Codeine, Empagliflozin, Meperidine, Morphine  and codeine, and Prednisone    Review of Systems Review of Systems  Constitutional:  Negative for fever.  HENT:  Positive for ear discharge and ear pain. Negative for facial swelling.        Right jaw pain     Physical Exam Triage Vital Signs ED Triage Vitals  Encounter Vitals Group     BP      Girls Systolic BP Percentile      Girls Diastolic BP Percentile      Boys Systolic BP Percentile      Boys Diastolic BP Percentile      Pulse      Resp      Temp      Temp src      SpO2      Weight      Height      Head Circumference      Peak Flow      Pain Score      Pain Loc      Pain Education      Exclude from Growth Chart    No data found.  Updated Vital Signs BP (!) 147/83 (BP Location: Left Arm)   Pulse 86   Temp 98.1 F (36.7 C) (Oral)   Resp 20   Wt 227 lb (103 kg)   SpO2 98%   BMI 31.66 kg/m   Visual Acuity Right Eye Distance:   Left Eye Distance:   Bilateral Distance:    Right Eye Near:   Left Eye Near:    Bilateral Near:     Physical Exam Vitals and nursing note reviewed.  Constitutional:      Appearance: Normal appearance. He is not ill-appearing.  HENT:     Head: Normocephalic and atraumatic.     Right Ear: Tympanic membrane, ear canal and external ear normal. There is no impacted cerumen.     Mouth/Throat:     Comments: Pain with palpation of the right TMJ.  No popping or clicking with range of motion of the mandible. Skin:    General: Skin is warm and dry.     Capillary Refill: Capillary refill takes less than 2 seconds.     Findings: No rash.  Neurological:     General: No focal deficit present.     Mental Status: He is alert and oriented to person, place, and time.      UC Treatments / Results  Labs (all labs ordered are listed, but only abnormal results are  displayed) Labs Reviewed - No data to display  EKG   Radiology No results found.  Procedures Procedures (including critical care time)  Medications Ordered  in UC Medications - No data to display  Initial Impression / Assessment and Plan / UC Course  I have reviewed the triage vital signs and the nursing notes.  Pertinent labs & imaging results that were available during my care of the patient were reviewed by me and considered in my medical decision making (see chart for details).   Patient is a nontoxic-appearing 62 year old male presenting for evaluation of right sided jaw pain as outlined in HPI above.  He reports that the pain goes into his ear and he is unsure if it is in his ear and his jaw.  He reports the pain started after he yawned a very hard several times in a row a few days ago.  Now he is having pain with range of motion of the mandible and with chewing.  On exam, the patient's right tympanic membrane is pearly gray in appearance with normal light reflex and his EAC is clear.  He is tender with palpation over the right TMJ.  He has no popping or clicking with range of motion of the mandible.  I suspect that he has sprained his TMJ.  The patient states that he cannot take steroids because prednisone  caused him to have vertigo type symptoms with vomiting and diarrhea and he had a recent nephrectomy which precludes the use of NSAIDs.  He has been using Tylenol  without significant relief.  He does have oxycodone  that was prescribed for him after his recent hospitalization surgery but he is reluctant to use it because it causes constipation.  I have advised the patient that he can apply ice or moist heat to his jaw to help with the pain and he could try some of the natural anti-inflammatory such as turmeric or wobenzym.   Final Clinical Impressions(s) / UC Diagnoses   Final diagnoses:  TMJ (sprain of temporomandibular joint), initial encounter     Discharge Instructions       As we discussed, NSAIDs are not an option due to the fact that she had a recent nephrectomy and you have got chronic kidney disease and steroids because significant vomiting and diarrhea.  I recommend trying turmeric 1000 mg twice daily to help with pain and inflammation.  You may also want to consider Wobenzym 3 tablets twice daily to help with pain and inflammation.  I would recommend you follow a soft diet so that you are not eating any hard proteins which require excessive force to chew and thereby increasing inflammation in your joint.  You may apply ice, or heat, to your jaw for 20 minutes at a time, 2-3 times a day, to help with pain and inflammation.  You may also supplement with over-the-counter Tylenol .  Follow the package instructions for dosing.     ED Prescriptions   None    PDMP not reviewed this encounter.     [1]  Social History Tobacco Use   Smoking status: Former    Current packs/day: 0.00    Types: Cigarettes    Quit date: 2010    Years since quitting: 15.9   Smokeless tobacco: Never  Vaping Use   Vaping status: Never Used  Substance Use Topics   Alcohol use: Not Currently    Comment: occasional   Drug use: Not Currently     Bernardino Ditch, NP 01/21/24 1823  "

## 2024-01-21 NOTE — Discharge Instructions (Addendum)
 As we discussed, NSAIDs are not an option due to the fact that she had a recent nephrectomy and you have got chronic kidney disease and steroids because significant vomiting and diarrhea.  I recommend trying turmeric 1000 mg twice daily to help with pain and inflammation.  You may also want to consider Wobenzym 3 tablets twice daily to help with pain and inflammation.  I would recommend you follow a soft diet so that you are not eating any hard proteins which require excessive force to chew and thereby increasing inflammation in your joint.  You may apply ice, or heat, to your jaw for 20 minutes at a time, 2-3 times a day, to help with pain and inflammation.  You may also supplement with over-the-counter Tylenol .  Follow the package instructions for dosing.
